# Patient Record
Sex: Male | Born: 1990 | Race: Black or African American | Hispanic: No | Marital: Single | Smoking: Current every day smoker
Health system: Southern US, Community
[De-identification: ages and names within clinical notes are randomized; demographics above are authoritative.]

## PROBLEM LIST (undated history)

## (undated) DIAGNOSIS — K219 Gastro-esophageal reflux disease without esophagitis: Secondary | ICD-10-CM

---
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
## 2011-08-14 ENCOUNTER — Emergency Department: Payer: Self-pay | Admitting: Emergency Medicine

## 2011-08-22 ENCOUNTER — Emergency Department: Payer: Self-pay | Admitting: Emergency Medicine

## 2012-06-09 ENCOUNTER — Emergency Department: Payer: Self-pay | Admitting: Emergency Medicine

## 2012-08-07 ENCOUNTER — Emergency Department: Payer: Self-pay | Admitting: Emergency Medicine

## 2012-12-22 ENCOUNTER — Emergency Department: Payer: Self-pay | Admitting: Emergency Medicine

## 2012-12-23 LAB — COMPREHENSIVE METABOLIC PANEL
Bilirubin,Total: 0.4 mg/dL (ref 0.2–1.0)
Calcium, Total: 9.3 mg/dL (ref 8.5–10.1)
Glucose: 130 mg/dL — ABNORMAL HIGH (ref 65–99)
Osmolality: 283 (ref 275–301)
SGOT(AST): 16 U/L (ref 15–37)
SGPT (ALT): 27 U/L (ref 12–78)
Total Protein: 7.5 g/dL (ref 6.4–8.2)

## 2012-12-23 LAB — URINALYSIS, COMPLETE
Bilirubin,UR: NEGATIVE
Glucose,UR: NEGATIVE mg/dL (ref 0–75)
Ketone: NEGATIVE
Protein: 100
Specific Gravity: 1.032 (ref 1.003–1.030)
WBC UR: 16 /HPF (ref 0–5)

## 2012-12-23 LAB — CBC
HCT: 42.6 % (ref 40.0–52.0)
MCH: 30.8 pg (ref 26.0–34.0)
Platelet: 128 10*3/uL — ABNORMAL LOW (ref 150–440)
RBC: 4.7 10*6/uL (ref 4.40–5.90)
WBC: 8.8 10*3/uL (ref 3.8–10.6)

## 2013-10-09 ENCOUNTER — Emergency Department: Payer: Self-pay | Admitting: Emergency Medicine

## 2013-10-09 LAB — URINALYSIS, COMPLETE
BILIRUBIN, UR: NEGATIVE
BLOOD: NEGATIVE
GLUCOSE, UR: NEGATIVE mg/dL (ref 0–75)
Ketone: NEGATIVE
LEUKOCYTE ESTERASE: NEGATIVE
Nitrite: NEGATIVE
Ph: 8 (ref 4.5–8.0)
Protein: 100
RBC,UR: 2 /HPF (ref 0–5)
SPECIFIC GRAVITY: 1.025 (ref 1.003–1.030)

## 2013-10-09 LAB — COMPREHENSIVE METABOLIC PANEL
ALBUMIN: 4.3 g/dL (ref 3.4–5.0)
ALT: 27 U/L
Alkaline Phosphatase: 72 U/L
Anion Gap: 8 (ref 7–16)
BILIRUBIN TOTAL: 0.7 mg/dL (ref 0.2–1.0)
BUN: 11 mg/dL (ref 7–18)
CO2: 26 mmol/L (ref 21–32)
Calcium, Total: 9.4 mg/dL (ref 8.5–10.1)
Chloride: 107 mmol/L (ref 98–107)
Creatinine: 1.02 mg/dL (ref 0.60–1.30)
EGFR (Non-African Amer.): >60
Glucose: 107 mg/dL — ABNORMAL HIGH (ref 65–99)
OSMOLALITY: 281 (ref 275–301)
Potassium: 3.7 mmol/L (ref 3.5–5.1)
SGOT(AST): 31 U/L (ref 15–37)
Sodium: 141 mmol/L (ref 136–145)
TOTAL PROTEIN: 8.3 g/dL — AB (ref 6.4–8.2)

## 2013-10-09 LAB — CBC WITH DIFFERENTIAL/PLATELET
Basophil #: 0 10*3/uL (ref 0.0–0.1)
Basophil %: 0.2 %
EOS PCT: 0.4 %
Eosinophil #: 0 10*3/uL (ref 0.0–0.7)
HCT: 48.9 % (ref 40.0–52.0)
HGB: 15.8 g/dL (ref 13.0–18.0)
LYMPHS PCT: 11.6 %
Lymphocyte #: 1.3 10*3/uL (ref 1.0–3.6)
MCH: 30 pg (ref 26.0–34.0)
MCHC: 32.4 g/dL (ref 32.0–36.0)
MCV: 93 fL (ref 80–100)
MONO ABS: 0.6 x10 3/mm (ref 0.2–1.0)
Monocyte %: 5.9 %
NEUTROS PCT: 81.9 %
Neutrophil #: 8.9 10*3/uL — ABNORMAL HIGH (ref 1.4–6.5)
Platelet: 145 10*3/uL — ABNORMAL LOW (ref 150–440)
RBC: 5.27 10*6/uL (ref 4.40–5.90)
RDW: 13.5 % (ref 11.5–14.5)
WBC: 10.8 10*3/uL — ABNORMAL HIGH (ref 3.8–10.6)

## 2013-10-09 LAB — TROPONIN I: Troponin-I: <0.02 ng/mL

## 2014-02-03 ENCOUNTER — Emergency Department: Payer: Self-pay | Admitting: Emergency Medicine

## 2014-04-24 ENCOUNTER — Emergency Department: Payer: Self-pay | Admitting: Student

## 2014-08-04 ENCOUNTER — Encounter: Payer: Self-pay | Admitting: Emergency Medicine

## 2014-08-04 ENCOUNTER — Emergency Department
Admission: EM | Admit: 2014-08-04 | Discharge: 2014-08-04 | Disposition: A | Discharge status: Home / Self Care | Payer: Self-pay | Attending: Emergency Medicine | Admitting: Emergency Medicine

## 2014-08-04 ENCOUNTER — Emergency Department: Payer: Self-pay

## 2014-08-04 DIAGNOSIS — W19XXXA Unspecified fall, initial encounter: Secondary | ICD-10-CM

## 2014-08-04 DIAGNOSIS — Y990 Civilian activity done for income or pay: Secondary | ICD-10-CM | POA: Insufficient documentation

## 2014-08-04 DIAGNOSIS — Y9389 Activity, other specified: Secondary | ICD-10-CM | POA: Insufficient documentation

## 2014-08-04 DIAGNOSIS — Z880 Allergy status to penicillin: Secondary | ICD-10-CM | POA: Insufficient documentation

## 2014-08-04 DIAGNOSIS — Z720 Tobacco use: Secondary | ICD-10-CM | POA: Insufficient documentation

## 2014-08-04 DIAGNOSIS — Y9289 Other specified places as the place of occurrence of the external cause: Secondary | ICD-10-CM | POA: Insufficient documentation

## 2014-08-04 DIAGNOSIS — W1839XA Other fall on same level, initial encounter: Secondary | ICD-10-CM | POA: Insufficient documentation

## 2014-08-04 DIAGNOSIS — S79912A Unspecified injury of left hip, initial encounter: Secondary | ICD-10-CM | POA: Insufficient documentation

## 2014-08-04 HISTORY — DX: Gastro-esophageal reflux disease without esophagitis: K21.9

## 2014-08-04 MED ORDER — IBUPROFEN 800 MG PO TABS: 800.0000 mg | ORAL_TABLET | Freq: Three times a day (TID) | ORAL | Status: DC | PRN

## 2014-08-04 NOTE — ED Notes (Signed)
Patient presents to the ED with left hip pain since early Sat. morning.  Patient states he was carrying a large amount of ice and he slipped and fell on his left hip.  Patient ambulatory to registration desk with no obvious difficulty.

## 2014-08-04 NOTE — ED Notes (Signed)
NAD noted at time of D/C. Pt denies questions or concerns. Pt ambulatory to the lobby at this time.  

## 2014-08-04 NOTE — ED Provider Notes (Signed)
[facility] Emergency Department Provider Note   ____________________________________________  Time seen: Approximately 10:49 AM  I have reviewed the triage vital signs and the nursing notes.   HISTORY  Chief Complaint Hip Pain    HPI Lance Hammond is a 24 y.o. male resents with complaints of left hip pain following a fall yesterday at work. States that the pain is progressed all through the night. Rates it as 8/10. Denies any visible bruising or pain elsewhere. Denies any hitting of head or LOC.   Past Medical History  Diagnosis Date  . Acid reflux     There are no active problems to display for this patient.   History reviewed. No pertinent past surgical history.  Current Outpatient Rx  Name  Route  Sig  Dispense  Refill  . ibuprofen (ADVIL,MOTRIN) 800 MG tablet   Oral   Take 1 tablet (800 mg total) by mouth every 8 (eight) hours as needed.   30 tablet   0     Allergies Amoxicillin  History reviewed. No pertinent family history.  Social History History  Substance Use Topics  . Smoking status: Current Every Day Smoker -- 0.50 packs/day for 6 years  . Smokeless tobacco: Not on file  . Alcohol Use: Yes     Comment: occasional    Review of Systems Constitutional: No fever/chills Eyes: No visual changes. ENT: No sore throat. Cardiovascular: Denies chest pain. Respiratory: Denies shortness of breath. Gastrointestinal: No abdominal pain.  No nausea, no vomiting.  No diarrhea.  No constipation. Genitourinary: Negative for dysuria. Musculoskeletal: Negative for back pain. Positive for left lateral hip pain only.  Skin: Negative for rash. Neurological: Negative for headaches, focal weakness or numbness.  10-point ROS otherwise negative.  ____________________________________________   PHYSICAL EXAM:  VITAL SIGNS: ED Triage Vitals  Enc Vitals Group     BP 08/04/14 1032 127/69 mmHg     Pulse Rate 08/04/14 1032 66     Resp  08/04/14 1032 15     Temp 08/04/14 1032 97.5 F (36.4 C)     Temp Source 08/04/14 1032 Oral     SpO2 08/04/14 1032 99 %     Weight 08/04/14 1032 145 lb (65.772 kg)     Height 08/04/14 1032  (1.803 m)     Head Cir --      Peak Flow --      Pain Score 08/04/14 1027 8     Pain Loc --      Pain Edu? --      Excl. in GC? --     Constitutional: Alert and oriented. Well appearing and in no acute distress. Musculoskeletal: No lower extremity tenderness nor edema.  No joint effusions.Positive for left lateral hip pain only. Ambulates without difficulty. Full range of motion left leg. Neurologic:  Normal speech and language. No gross focal neurologic deficits are appreciated. Speech is normal. No gait instability. No obvious bruising. Skin:  Skin is warm, dry and intact. No rash noted. Psychiatric: Mood and affect are normal. Speech and behavior are normal.  ____________________________________________   LABS (all labs ordered are listed, but only abnormal results are displayed)  Labs Reviewed - No data to display ____________________________________________  EKG  Not applicable ____________________________________________  RADIOLOGY  X-rays interpreted by radiologist and reviewed by myself. Negative for fracture dislocation. ____________________________________________   PROCEDURES  Procedure(s) performed: None  Critical Care performed: No  ____________________________________________   INITIAL IMPRESSION / ASSESSMENT AND PLAN / ED COURSE  Pertinent labs & imaging results that were available during my care of the patient were reviewed by me and considered in my medical decision making (see chart for details).  Left hip contusion. Rx Motrin 800 mg 3 times a day. Will return to the ER if worsening of symptoms. No other PCE at this time. ____________________________________________   FINAL CLINICAL IMPRESSION(S) / ED DIAGNOSES  Final diagnoses:  Fall  Hip  injury, left, initial encounter      Evangeline DakinCharles M Maleiya Pergola, PA-C 08/04/14 1225  Jene Everyobert Kinner, MD 08/04/14 [phone]

## 2014-08-04 NOTE — ED Notes (Signed)
Pt c/o left hip pain 8/10 throbbing following a fall at work early yesterday morning.  He has not taken any medication for his symptoms.  He states that there is swelling present but no visible bruising.  Denies pain anywhere else.  Denies hitting head.  No LOC.

## 2015-02-15 ENCOUNTER — Emergency Department
Admission: EM | Admit: 2015-02-15 | Discharge: 2015-02-15 | Disposition: A | Discharge status: Home / Self Care | Payer: Self-pay | Attending: Emergency Medicine | Admitting: Emergency Medicine

## 2015-02-15 ENCOUNTER — Encounter: Payer: Self-pay | Admitting: *Deleted

## 2015-02-15 DIAGNOSIS — R809 Proteinuria, unspecified: Secondary | ICD-10-CM | POA: Insufficient documentation

## 2015-02-15 DIAGNOSIS — Z880 Allergy status to penicillin: Secondary | ICD-10-CM | POA: Insufficient documentation

## 2015-02-15 DIAGNOSIS — R1900 Intra-abdominal and pelvic swelling, mass and lump, unspecified site: Secondary | ICD-10-CM | POA: Insufficient documentation

## 2015-02-15 DIAGNOSIS — R05 Cough: Secondary | ICD-10-CM | POA: Insufficient documentation

## 2015-02-15 DIAGNOSIS — R109 Unspecified abdominal pain: Secondary | ICD-10-CM

## 2015-02-15 DIAGNOSIS — R1032 Left lower quadrant pain: Secondary | ICD-10-CM | POA: Insufficient documentation

## 2015-02-15 DIAGNOSIS — F17200 Nicotine dependence, unspecified, uncomplicated: Secondary | ICD-10-CM | POA: Insufficient documentation

## 2015-02-15 LAB — CBC WITH DIFFERENTIAL/PLATELET
BASOS PCT: 1 %
Basophils Absolute: 0.1 10*3/uL (ref 0–0.1)
Eosinophils Absolute: 0.4 10*3/uL (ref 0–0.7)
Eosinophils Relative: 5 %
HEMATOCRIT: 47.5 % (ref 40.0–52.0)
Hemoglobin: 15.4 g/dL (ref 13.0–18.0)
Lymphocytes Relative: 27 %
Lymphs Abs: 2.7 10*3/uL (ref 1.0–3.6)
MCH: 29.4 pg (ref 26.0–34.0)
MCHC: 32.5 g/dL (ref 32.0–36.0)
MCV: 90.7 fL (ref 80.0–100.0)
MONO ABS: 1.1 10*3/uL — AB (ref 0.2–1.0)
MONOS PCT: 11 %
NEUTROS ABS: 5.6 10*3/uL (ref 1.4–6.5)
Neutrophils Relative %: 56 %
Platelets: 145 10*3/uL — ABNORMAL LOW (ref 150–440)
RBC: 5.24 MIL/uL (ref 4.40–5.90)
RDW: 14 % (ref 11.5–14.5)
WBC: 9.9 10*3/uL (ref 3.8–10.6)

## 2015-02-15 LAB — URINALYSIS COMPLETE WITH MICROSCOPIC (ARMC ONLY)
BACTERIA UA: NONE SEEN
Bilirubin Urine: NEGATIVE
GLUCOSE, UA: NEGATIVE mg/dL
HGB URINE DIPSTICK: NEGATIVE
Ketones, ur: NEGATIVE mg/dL
LEUKOCYTES UA: NEGATIVE
Nitrite: NEGATIVE
PROTEIN: 30 mg/dL — AB
Specific Gravity, Urine: 1.029 (ref 1.005–1.030)
pH: 6 (ref 5.0–8.0)

## 2015-02-15 LAB — COMPREHENSIVE METABOLIC PANEL
ALBUMIN: 4.5 g/dL (ref 3.5–5.0)
ALT: 16 U/L — ABNORMAL LOW (ref 17–63)
AST: 22 U/L (ref 15–41)
Alkaline Phosphatase: 67 U/L (ref 38–126)
Anion gap: 7 (ref 5–15)
BUN: 18 mg/dL (ref 6–20)
CALCIUM: 9.8 mg/dL (ref 8.9–10.3)
CO2: 27 mmol/L (ref 22–32)
CREATININE: 0.89 mg/dL (ref 0.61–1.24)
Chloride: 106 mmol/L (ref 101–111)
GFR calc Af Amer: >60 mL/min (ref >60)
GFR calc non Af Amer: >60 mL/min (ref >60)
GLUCOSE: 88 mg/dL (ref 65–99)
Potassium: 4.5 mmol/L (ref 3.5–5.1)
Sodium: 140 mmol/L (ref 135–145)
Total Bilirubin: 0.9 mg/dL (ref 0.3–1.2)
Total Protein: 8.3 g/dL — ABNORMAL HIGH (ref 6.5–8.1)

## 2015-02-15 NOTE — Discharge Instructions (Signed)
If you have increased pain, fever, vomiting, swelling especially a bulging area that turns red or does not go down when you gently massage it concerning for hernia, or you feel worse in any way please return to the emergency room. We do note a slight amount of proteinuria. Which is protein in her urine. This is not related to anything that brought her in today but we do noticed that incidentally and we ask you follow-up as an outpatient. It is certainly possible to your abdominal pain was caused from coughing, however your lungs are clear and I do not hear pneumonia, if you encounter difficulty with breathing or worsening cough please return to the emergency department.

## 2015-02-15 NOTE — ED Notes (Signed)
Lab results reviewed. CBC and UA WNL. Awaiting CMP.

## 2015-02-15 NOTE — ED Provider Notes (Addendum)
[facility] Emergency Department Provider Note  ____________________________________________   I have reviewed the triage vital signs and the nursing notes.   HISTORY  Chief Complaint Abdominal Pain    HPI Lance Hammond is a 24 y.o. male presents today complaining of feeling that his left lower quadrant is tender in the little bit "swollen" after coughing for the last few days. Patient is a smoker has a nonproductive cough. Cough is actually getting better. He has no pain in that area at this time. He has not had a hernia in the past. He has no testicular or penile pain or discharge. No dysuria no urinary frequency no diarrhea and no change in his bowel or bladder habits no difficulty eating, states the pain is 1 out of 10 when it is bad. He has been having normal bowel movements. He wants to make sure everything is okay to her he states.  Past Medical History  Diagnosis Date  . Acid reflux     There are no active problems to display for this patient.   History reviewed. No pertinent past surgical history.  Current Outpatient Rx  Name  Route  Sig  Dispense  Refill  . ibuprofen (ADVIL,MOTRIN) 800 MG tablet   Oral   Take 1 tablet (800 mg total) by mouth every 8 (eight) hours as needed.   30 tablet   0     Allergies Amoxicillin  No family history on file.  Social History Social History  Substance Use Topics  . Smoking status: Current Every Day Smoker -- 0.50 packs/day for 6 years  . Smokeless tobacco: None  . Alcohol Use: Yes     Comment: occasional    Review of Systems Constitutional: No fever/chills Eyes: No visual changes. ENT: No sore throat. No stiff neck no neck pain Cardiovascular: Denies chest pain. Respiratory: Denies shortness of breath. Gastrointestinal:   no vomiting.  No diarrhea.  No constipation. Genitourinary: Negative for dysuria. Musculoskeletal: Negative lower extremity swelling Skin: Negative for  rash. Neurological: Negative for headaches, focal weakness or numbness. 10-point ROS otherwise negative.  ____________________________________________   PHYSICAL EXAM:  VITAL SIGNS: ED Triage Vitals  Enc Vitals Group     BP 02/15/15 1644 115/73 mmHg     Pulse Rate 02/15/15 1644 60     Resp 02/15/15 1644 18     Temp 02/15/15 1644 97.4 F (36.3 C)     Temp src --      SpO2 02/15/15 1644 99 %     Weight 02/15/15 1644 145 lb (65.772 kg)     Height 02/15/15 1644 6' (1.829 m)     Head Cir --      Peak Flow --      Pain Score 02/15/15 1930 2     Pain Loc --      Pain Edu? --      Excl. in GC? --     Constitutional: Alert and oriented. Well appearing and in no acute distress. Eyes: Conjunctivae are normal. PERRL. EOMI. Head: Atraumatic. Nose: No congestion/rhinnorhea. Mouth/Throat: Mucous membranes are moist.  Oropharynx non-erythematous. Neck: No stridor.   Nontender with no meningismus Cardiovascular: Normal rate, regular rhythm. Grossly normal heart sounds.  Good peripheral circulation. Respiratory: Normal respiratory effort.  No retractions. Lungs CTAB. Abdominal: Soft and nontender. No distention. No guarding no rebound, there is no evidence of any area of abnormality that I can detect on exam. When I asked the patient to specify the area he actually  pointed to the anterior superior iliac spine on the left which she thought was where he noticed the issue before. When I demonstrated the symmetry between the side of the other side than he states that the swelling must be gone now. There is no tenderness to these areas. Specifically there is no left lower quadrant tenderness or right lower quadrant tenderness. Back:  There is no focal tenderness or step off there is no midline tenderness there are no lesions noted. there is no CVA tenderness GU exam: No testicular mass no testicular swelling no penile mass no penile swelling no tenderness to palpation no erythema no scrotal pain or  discomfort no evidence of inguinal herniation during Valsalva. Musculoskeletal: No lower extremity tenderness. No joint effusions, no DVT signs strong distal pulses no edema Neurologic:  Normal speech and language. No gross focal neurologic deficits are appreciated.  Skin:  Skin is warm, dry and intact. No rash noted. Psychiatric: Mood and affect are normal. Speech and behavior are normal.  ____________________________________________   LABS (all labs ordered are listed, but only abnormal results are displayed)  Labs Reviewed  CBC WITH DIFFERENTIAL/PLATELET - Abnormal; Notable for the following:    Platelets 145 (*)    Monocytes Absolute 1.1 (*)    All other components within normal limits  COMPREHENSIVE METABOLIC PANEL - Abnormal; Notable for the following:    Total Protein 8.3 (*)    ALT 16 (*)    All other components within normal limits  URINALYSIS COMPLETEWITH MICROSCOPIC ([facility] ONLY) - Abnormal; Notable for the following:    Color, Urine YELLOW (*)    APPearance CLEAR (*)    Protein, ur 30 (*)    Squamous Epithelial / LPF 0-5 (*)    All other components within normal limits   ____________________________________________  EKG  I personally interpreted any EKGs ordered by me or triage  ____________________________________________  RADIOLOGY  I reviewed any imaging ordered by me or triage that were performed during my shift ____________________________________________   PROCEDURES  Procedure(s) performed: None  Critical Care performed: None  ____________________________________________   INITIAL IMPRESSION / ASSESSMENT AND PLAN / ED COURSE  Pertinent labs & imaging results that were available during my care of the patient were reviewed by me and considered in my medical decision making (see chart for details).  Patient here because he noted that his abdomen seemed to be funny in appearance and possibly slightly tender, I do not detect the upper mildly he  indicates. There is no evidence of a direct or indirect hernia there is no evidence of an inguinal bulge or abnormality there is no evidence of appendicitis there is no evidence of testicular or scrotal pain or discomfort, patient has mild pertinent or you which we have made him aware of him which we have advised him to follow-up for. However I do not think that is causing his symptoms. Patient has no complaints at this time he is pain-free, he is nontender, there is no abnormality noted to exam, his vital signs are reassuring and his blood work is reassuring. I have explained him that possible that he may have a slight herniation although he has no evidence of incarceration and no evidence of hernia that I can detect. Patient does not have worsening symptoms when he lists heavy objects. I've advised him that if there is a hernia that I am not seeing it could remanifest itself, and therefore he needs to be vigilant. I've advised him on what to look out for  in terms of incarcerated hernia or slow or smoldering intra-abdominal pathology which is not at this time evident to medical investigation. Patient very comfortable with this plan. Again he will follow pulses worse proteinuria return precautions and follow-up and been given and understood, patient does have a history of proteinuria which is been much worse than it is today. Fluids are slightly low as well but this is also baseline and we have again advise close outpatient follow-up.  ----------------------------------------- 8:21 PM on 02/15/2015 -----------------------------------------  Patient with serial abdominal exams revealing no abdominal pathology this includes during Valsalva maneuver and also while standing up. ____________________________________________   FINAL CLINICAL IMPRESSION(S) / ED DIAGNOSES  Final diagnoses:  None     Jeanmarie Plant, MD 02/15/15 1957  Jeanmarie Plant, MD 02/15/15 [id number]  Jeanmarie Plant, MD 02/15/15  2022

## 2015-02-15 NOTE — ED Notes (Signed)
All labs WNL.

## 2015-02-15 NOTE — ED Notes (Signed)
Patient report 2/10 left lower abdominal pain that began today after noticing LLQ being swollen and firm to the touch. Patient denies urinary symptoms or constipation. Patient denies recent trauma to the area.

## 2016-04-24 ENCOUNTER — Emergency Department
Admission: EM | Admit: 2016-04-24 | Discharge: 2016-04-24 | Disposition: A | Discharge status: Home / Self Care | Payer: Self-pay | Attending: Emergency Medicine | Admitting: Emergency Medicine

## 2016-04-24 ENCOUNTER — Emergency Department: Payer: Self-pay

## 2016-04-24 ENCOUNTER — Encounter: Payer: Self-pay | Admitting: Medical Oncology

## 2016-04-24 DIAGNOSIS — F17200 Nicotine dependence, unspecified, uncomplicated: Secondary | ICD-10-CM | POA: Insufficient documentation

## 2016-04-24 DIAGNOSIS — R0789 Other chest pain: Secondary | ICD-10-CM | POA: Insufficient documentation

## 2016-04-24 DIAGNOSIS — R05 Cough: Secondary | ICD-10-CM | POA: Insufficient documentation

## 2016-04-24 MED ORDER — NAPROXEN 500 MG PO TABS: 500.0000 mg | ORAL_TABLET | Freq: Two times a day (BID) | ORAL | 0 refills | Status: DC

## 2016-04-24 NOTE — ED Triage Notes (Signed)
Pt ambulatory to triage with reports of left sided chest pain that began last night, pt reports that the pain is worse when he moves and bends over. Pt also reports he has had a cough.

## 2016-04-24 NOTE — ED Notes (Signed)
Pt verbalized understanding of discharge instructions. NAD at this time. 

## 2016-04-24 NOTE — Discharge Instructions (Signed)
Follow up with the primary care provider of your choice for symptoms that are not improving over the next few days.  Return to the ER for symptoms that change or worsen if unable to schedule an appointment. 

## 2016-04-24 NOTE — ED Notes (Signed)
Dr Lenard LancePaduchowski signed EKG and states that pt is okay to go to [facility] care area.

## 2016-04-24 NOTE — ED Provider Notes (Signed)
[facility] Emergency Department Provider Note ____________________________________________   None    (approximate)  I have reviewed the triage vital signs and the nursing notes.   HISTORY  Chief Complaint Chest Pain   HPI Lance Hammond is a 26 y.o. male who presents to the emergency department for evaluation of left side chest pain. He reports intermittent cough for the past 10 days. He denies fever. Pain worsens with deep breath and movement. He took "2 low dose aspirin" without relief. He denies cardiac history.   Past Medical History:  Diagnosis Date  . Acid reflux     There are no active problems to display for this patient.   History reviewed. No pertinent surgical history.  Prior to Admission medications   Medication Sig Start Date End Date Taking? Authorizing Provider  naproxen (NAPROSYN) 500 MG tablet Take 1 tablet (500 mg total) by mouth 2 (two) times daily with a meal. 04/24/16   Chinita Pester, FNP    Allergies Amoxicillin  No family history on file.  Social History Social History  Substance Use Topics  . Smoking status: Current Every Day Smoker    Packs/day: 0.50    Years: 6.00  . Smokeless tobacco: Never Used  . Alcohol use Yes     Comment: occasional    Review of Systems Constitutional: No fever/chills Cardiovascular: Positive for chest pain. Respiratory: Denies shortness of breath. Gastrointestinal: No abdominal pain.  No nausea, no vomiting.  No diarrhea. Musculoskeletal: Positive for chest wall pain Neurological: Negative for headaches, focal weakness or numbness. ____________________________________________   PHYSICAL EXAM:  VITAL SIGNS: ED Triage Vitals  Enc Vitals Group     BP 04/24/16 0721 125/81     Pulse Rate 04/24/16 0721 60     Resp 04/24/16 0721 15     Temp 04/24/16 0721 98.6 F (37 C)     Temp src --      SpO2 04/24/16 0721 99 %     Weight 04/24/16 0718 145 lb (65.8 kg)     Height  04/24/16 0718 6' (1.829 m)     Head Circumference --      Peak Flow --      Pain Score 04/24/16 0718 7     Pain Loc --      Pain Edu? --      Excl. in GC? --     Constitutional: Alert and oriented. Well appearing and in no acute distress. Eyes: Conjunctivae are normal. PERRL. EOMI. Mouth/Throat: Mucous membranes are moist.  Oropharynx non-erythematous. Neck: No stridor.   Cardiovascular: Normal rate, regular rhythm. Grossly normal heart sounds.  Good peripheral circulation. Respiratory: Normal respiratory effort.  No retractions. Lungs CTAB. Musculoskeletal: Left anterior chest wall tender to palpation. Neurologic:  Normal speech and language.  Skin:  Skin is warm, dry and intact. No rash noted. Psychiatric: Mood and affect are normal. Speech and behavior are normal.  ____________________________________________   LABS (all labs ordered are listed, but only abnormal results are displayed)  Labs Reviewed - No data to display ____________________________________________  EKG  ED ECG REPORT  Date: 04/24/2016 EKG Time: 0721 Rate: 61 Rhythm: normal sinus rhythm QRS Axis: left Intervals: normal ST/T Wave abnormalities: normal Conduction Disturbances: none Narrative Interpretation: unremarkable  ____________________________________________  RADIOLOGY  Chest x-ray negative for cardiopulmonary abnormality per radiology. ____________________________________________   PROCEDURES  Procedure(s) performed: None  Procedures  Critical Care performed: No  ____________________________________________   INITIAL IMPRESSION / ASSESSMENT AND PLAN / ED  COURSE  Pertinent labs & imaging results that were available during my care of the patient were reviewed by me and considered in my medical decision making (see chart for details).  26 year old male presenting for evaluation of chest wall pain. EKG and chest x-ray reassuring. Pain is reproducible with movement and palpation.  He will be treated with naprosyn and advised to follow up with the PCP of his choice if not improving over the next few days or return to the ER for symptoms that change or worsen.      ____________________________________________   FINAL CLINICAL IMPRESSION(S) / ED DIAGNOSES  Final diagnoses:  Chest wall pain      NEW MEDICATIONS STARTED DURING THIS VISIT:  Discharge Medication List as of 04/24/2016  8:04 AM    START taking these medications   Details  naproxen (NAPROSYN) 500 MG tablet Take 1 tablet (500 mg total) by mouth 2 (two) times daily with a meal., Starting Sat 04/24/2016, Print         Note:  This document was prepared using Dragon voice recognition software and may include unintentional dictation errors.    Chinita PesterCari B Lucresha Dismuke, FNP 04/24/16 0830    Minna AntisKevin Paduchowski, MD 04/24/16 [phone]

## 2018-01-04 ENCOUNTER — Emergency Department
Admission: EM | Admit: 2018-01-04 | Discharge: 2018-01-04 | Disposition: A | Discharge status: Home / Self Care | Payer: Self-pay | Attending: Emergency Medicine | Admitting: Emergency Medicine

## 2018-01-04 ENCOUNTER — Other Ambulatory Visit: Payer: Self-pay

## 2018-01-04 ENCOUNTER — Encounter: Payer: Self-pay | Admitting: Emergency Medicine

## 2018-01-04 DIAGNOSIS — T7840XA Allergy, unspecified, initial encounter: Secondary | ICD-10-CM | POA: Insufficient documentation

## 2018-01-04 DIAGNOSIS — L509 Urticaria, unspecified: Secondary | ICD-10-CM | POA: Insufficient documentation

## 2018-01-04 DIAGNOSIS — F1210 Cannabis abuse, uncomplicated: Secondary | ICD-10-CM | POA: Insufficient documentation

## 2018-01-04 DIAGNOSIS — F17210 Nicotine dependence, cigarettes, uncomplicated: Secondary | ICD-10-CM | POA: Insufficient documentation

## 2018-01-04 MED ORDER — PREDNISONE 10 MG PO TABS: ORAL_TABLET | ORAL | 0 refills | Status: DC

## 2018-01-04 MED ORDER — METHYLPREDNISOLONE SODIUM SUCC 125 MG IJ SOLR
125.0000 mg | Freq: Once | INTRAMUSCULAR | Status: DC
  Filled 2018-01-04: qty 2

## 2018-01-04 MED ORDER — DIPHENHYDRAMINE HCL 25 MG PO CAPS: 25.0000 mg | ORAL_CAPSULE | ORAL | 0 refills | Status: DC | PRN

## 2018-01-04 MED ORDER — FAMOTIDINE 20 MG PO TABS
20.0000 mg | ORAL_TABLET | Freq: Once | ORAL | Status: AC
  Administered 2018-01-04: 20 mg via ORAL
  Filled 2018-01-04: qty 1

## 2018-01-04 NOTE — ED Triage Notes (Signed)
PT c/o rash with itch since yesterday throughout. Denies fevers. NAD noted , VSS

## 2018-01-04 NOTE — ED Notes (Signed)
First Nurse Note: patient complaining of "hives", no SHOB.  Alert and oriented, NAD.

## 2018-01-04 NOTE — ED Notes (Signed)
See triage note  States he developed rash/hives about 2 days ago  Positive itching   No resp

## 2018-01-04 NOTE — ED Provider Notes (Signed)
[facility] Emergency Department Provider Note  ____________________________________________  Time seen: Approximately 10:04 AM  I have reviewed the triage vital signs and the nursing notes.   HISTORY  Chief Complaint Urticaria    HPI Lance Hammond is a 27 y.o. male that presents to the emergency department for rash and urticaria for 2 days. It started on his right arm and has been spreading. He changed his body soap one week ago and also purchased new underwear. No new medications. He has 2 dogs. No insect or tick bites. He has not been outside in the woods recently. He has taken benadryl, which is helping him sleep but not improving the itching. No SOB, nausea, vomiting.   Past Medical History:  Diagnosis Date  . Acid reflux     There are no active problems to display for this patient.   History reviewed. No pertinent surgical history.  Prior to Admission medications   Medication Sig Start Date End Date Taking? Authorizing Provider  diphenhydrAMINE (BENADRYL) 25 mg capsule Take 1 capsule (25 mg total) by mouth every 4 (four) hours as needed. 01/04/18 01/04/19  Enid Derry, PA-C  naproxen (NAPROSYN) 500 MG tablet Take 1 tablet (500 mg total) by mouth 2 (two) times daily with a meal. 04/24/16   Triplett, Cari B, FNP  predniSONE (DELTASONE) 10 MG tablet Take 6 tablets on day 1, take 5 tablets on day 2, take 4 tablets on day 3, take 3 tablets on day 4, take 2 tablets on day 5, take 1 tablet on day 6 01/04/18   Enid Derry, PA-C    Allergies Amoxicillin  No family history on file.  Social History Social History   Tobacco Use  . Smoking status: Current Every Day Smoker    Packs/day: 0.50    Years: 6.00    Pack years: 3.00  . Smokeless tobacco: Never Used  Substance Use Topics  . Alcohol use: Yes    Comment: occasional  . Drug use: Yes    Types: Marijuana    Comment: Daily      Review of Systems  Constitutional: No  fever/chills Cardiovascular: No chest pain. Respiratory:  No SOB. Gastrointestinal: No nausea, no vomiting.  Musculoskeletal: Negative for musculoskeletal pain. Skin: Negative for abrasions, lacerations, ecchymosis. Positive for rash.  Neurological: Negative for headaches, numbness or tingling   ____________________________________________   PHYSICAL EXAM:  VITAL SIGNS: ED Triage Vitals  Enc Vitals Group     BP 01/04/18 0916 120/75     Pulse Rate 01/04/18 0916 79     Resp 01/04/18 0916 18     Temp 01/04/18 0916 97.8 F (36.6 C)     Temp Source 01/04/18 0916 Oral     SpO2 01/04/18 0916 100 %     Weight --      Height --      Head Circumference --      Peak Flow --      Pain Score 01/04/18 0918 0     Pain Loc --      Pain Edu? --      Excl. in GC? --      Constitutional: Alert and oriented. Well appearing and in no acute distress. Eyes: Conjunctivae are normal. PERRL. EOMI. Head: Atraumatic. ENT:      Ears:      Nose: No congestion/rhinnorhea.      Mouth/Throat: Mucous membranes are moist.  Neck: No stridor.   Cardiovascular: Normal rate, regular rhythm.  Good peripheral circulation.  Respiratory: Normal respiratory effort without tachypnea or retractions. Lungs CTAB. Good air entry to the bases with no decreased or absent breath sounds. Musculoskeletal: Full range of motion to all extremities. No gross deformities appreciated. Neurologic:  Normal speech and language. No gross focal neurologic deficits are appreciated.  Skin:  Skin is warm, dry and intact. Wheals to bilateral arms, trunk, legs.  Psychiatric: Mood and affect are normal. Speech and behavior are normal. Patient exhibits appropriate insight and judgement.   ____________________________________________   LABS (all labs ordered are listed, but only abnormal results are displayed)  Labs Reviewed - No data to  display ____________________________________________  EKG   ____________________________________________  RADIOLOGY  No results found.  ____________________________________________    PROCEDURES  Procedure(s) performed:    Procedures    Medications  methylPREDNISolone sodium succinate (SOLU-MEDROL) 125 mg/2 mL injection 125 mg (125 mg Intramuscular Refused 01/04/18 1053)  famotidine (PEPCID) tablet 20 mg (20 mg Oral Given 01/04/18 1053)     ____________________________________________   INITIAL IMPRESSION / ASSESSMENT AND PLAN / ED COURSE  Pertinent labs & imaging results that were available during my care of the patient were reviewed by me and considered in my medical decision making (see chart for details).  Review of the [place] CSRS was performed in accordance of the [facility] prior to dispensing any controlled drugs.     Patient's diagnosis is consistent with allergic reaction. Reaction likely from body wash. Patient will be discharged home with prescriptions for prednisone and benadryl. Patient is to follow up with PCP as directed. Patient is given ED precautions to return to the ED for any worsening or new symptoms.     ____________________________________________  FINAL CLINICAL IMPRESSION(S) / ED DIAGNOSES  Final diagnoses:  Allergic reaction, initial encounter      NEW MEDICATIONS STARTED DURING THIS VISIT:  ED Discharge Orders         Ordered    predniSONE (DELTASONE) 10 MG tablet     01/04/18 1044    diphenhydrAMINE (BENADRYL) 25 mg capsule  Every 4 hours PRN     01/04/18 1044              This chart was dictated using voice recognition software/Dragon. Despite best efforts to proofread, errors can occur which can change the meaning. Any change was purely unintentional.    Enid Derry, PA-C 01/04/18 1304    Sharman Cheek, MD 01/04/18 1538

## 2018-02-17 ENCOUNTER — Emergency Department: Payer: Self-pay

## 2018-02-17 ENCOUNTER — Other Ambulatory Visit: Payer: Self-pay

## 2018-02-17 ENCOUNTER — Emergency Department
Admission: EM | Admit: 2018-02-17 | Discharge: 2018-02-17 | Disposition: A | Discharge status: Home / Self Care | Payer: Self-pay | Attending: Emergency Medicine | Admitting: Emergency Medicine

## 2018-02-17 ENCOUNTER — Encounter: Payer: Self-pay | Admitting: Emergency Medicine

## 2018-02-17 DIAGNOSIS — Y9289 Other specified places as the place of occurrence of the external cause: Secondary | ICD-10-CM | POA: Insufficient documentation

## 2018-02-17 DIAGNOSIS — F1210 Cannabis abuse, uncomplicated: Secondary | ICD-10-CM | POA: Insufficient documentation

## 2018-02-17 DIAGNOSIS — F17210 Nicotine dependence, cigarettes, uncomplicated: Secondary | ICD-10-CM | POA: Insufficient documentation

## 2018-02-17 DIAGNOSIS — W19XXXA Unspecified fall, initial encounter: Secondary | ICD-10-CM

## 2018-02-17 DIAGNOSIS — W1789XA Other fall from one level to another, initial encounter: Secondary | ICD-10-CM | POA: Insufficient documentation

## 2018-02-17 DIAGNOSIS — Y9389 Activity, other specified: Secondary | ICD-10-CM | POA: Insufficient documentation

## 2018-02-17 DIAGNOSIS — S60222A Contusion of left hand, initial encounter: Secondary | ICD-10-CM | POA: Insufficient documentation

## 2018-02-17 DIAGNOSIS — Y990 Civilian activity done for income or pay: Secondary | ICD-10-CM | POA: Insufficient documentation

## 2018-02-17 NOTE — Discharge Instructions (Addendum)
Follow-up with Dr. Odis LusterBowers or a orthopedic of [employer] choice if you are not better in 3 to 5 days.  Wear the fingers buddy taped for several days.  Take Tylenol and ibuprofen as needed for pain.  Apply ice.  Return if worsening.

## 2018-02-17 NOTE — ED Notes (Signed)
Buddy tape to fingers applied by NT.

## 2018-02-17 NOTE — ED Triage Notes (Signed)
Pt to ED via POV, states that he was at work and fell off the loading dock and tried to catch himself. Pt c/o left hand pain. Pt is in NAD

## 2018-02-17 NOTE — ED Provider Notes (Signed)
[facility] Emergency Department Provider Note  ____________________________________________   First MD Initiated Contact with Patient 02/17/18 1523     (approximate)  I have reviewed the triage vital signs and the nursing notes.   HISTORY  Chief Complaint Hand Injury    HPI Lance Hammond is a 27 y.o. male presents to the emergency department complaining of left hand pain.  He states he fell off a 5 foot loading dock and tried to catch himself.  He denies any other injuries at this time.  He denies any numbness or tingling.  He denies head injury.    Past Medical History:  Diagnosis Date  . Acid reflux     There are no active problems to display for this patient.   History reviewed. No pertinent surgical history.  Prior to Admission medications   Medication Sig Start Date End Date Taking? Authorizing Provider  diphenhydrAMINE (BENADRYL) 25 mg capsule Take 1 capsule (25 mg total) by mouth every 4 (four) hours as needed. 01/04/18 01/04/19  Enid Derry, PA-C  naproxen (NAPROSYN) 500 MG tablet Take 1 tablet (500 mg total) by mouth 2 (two) times daily with a meal. 04/24/16   Triplett, Cari B, FNP  predniSONE (DELTASONE) 10 MG tablet Take 6 tablets on day 1, take 5 tablets on day 2, take 4 tablets on day 3, take 3 tablets on day 4, take 2 tablets on day 5, take 1 tablet on day 6 01/04/18   Enid Derry, PA-C    Allergies Amoxicillin  No family history on file.  Social History Social History   Tobacco Use  . Smoking status: Current Every Day Smoker    Packs/day: 0.50    Years: 6.00    Pack years: 3.00  . Smokeless tobacco: Never Used  Substance Use Topics  . Alcohol use: Yes    Comment: occasional  . Drug use: Yes    Types: Marijuana    Comment: Daily     Review of Systems  Constitutional: No fever/chills Eyes: No visual changes. ENT: No sore throat. Respiratory: Denies cough Genitourinary: Negative for  dysuria. Musculoskeletal: Negative for back pain.  Left hand pain Skin: Negative for rash.    ____________________________________________   PHYSICAL EXAM:  VITAL SIGNS: ED Triage Vitals [02/17/18 1524]  Enc Vitals Group     BP 117/75     Pulse Rate 79     Resp      Temp 98.2 F (36.8 C)     Temp Source Oral     SpO2 100 %     Weight 146 lb (66.2 kg)     Height 6' (1.829 m)     Head Circumference      Peak Flow      Pain Score      Pain Loc      Pain Edu?      Excl. in GC?     Constitutional: Alert and oriented. Well appearing and in no acute distress. Eyes: Conjunctivae are normal.  Head: Atraumatic. Nose: No congestion/rhinnorhea. Mouth/Throat: Mucous membranes are moist.   Neck:  supple no lymphadenopathy noted Cardiovascular: Normal rate, regular rhythm. Heart sounds are normal Respiratory: Normal respiratory effort.  No retractions, lungs c t a  GU: deferred Musculoskeletal: FROM all extremities, warm and well perfused, the left hand has tender swollen fingers at the third fourth MCPs.  Neurovascular is intact Neurologic:  Normal speech and language.  Skin:  Skin is warm, dry and intact. No rash  noted. Psychiatric: Mood and affect are normal. Speech and behavior are normal.  ____________________________________________   LABS (all labs ordered are listed, but only abnormal results are displayed)  Labs Reviewed - No data to display ____________________________________________   ____________________________________________  RADIOLOGY  X-ray of the left hand is negative for fracture  ____________________________________________   PROCEDURES  Procedure(s) performed: Fingers were buddy taped by the nursing staff  Procedures    ____________________________________________   INITIAL IMPRESSION / ASSESSMENT AND PLAN / ED COURSE  Pertinent labs & imaging results that were available during my care of the patient were reviewed by me and considered  in my medical decision making (see chart for details).   Patient is 27 year old male presents emergency department complaining of left hand pain after a fall.  Physical exam shows the third and fourth fingers of the left hand to be tender and swollen.  X-ray of the left hand is negative for fracture.  Explained the results to the patient.  The third and fourth fingers were buddy taped as a splint.  He is to apply ice.  Take Tylenol or ibuprofen as needed.  Follow-up with orthopedics if not better in 5 to 7 days.  He states he understands will comply.  He was discharged in stable condition.     As part of my medical decision making, I reviewed the following data within the electronic MEDICAL RECORD NUMBER Nursing notes reviewed and incorporated, Old chart reviewed, Radiograph reviewed x-ray left hand is negative, Notes from prior ED visits and [place] Controlled Substance Database  ____________________________________________   FINAL CLINICAL IMPRESSION(S) / ED DIAGNOSES  Final diagnoses:  Contusion of left hand, initial encounter  Fall, initial encounter      NEW MEDICATIONS STARTED DURING THIS VISIT:  New Prescriptions   No medications on file     Note:  This document was prepared using Dragon voice recognition software and may include unintentional dictation errors.    Faythe GheeFisher, Cavin Longman W, PA-C 02/17/18 1936    Jeanmarie PlantMcShane, James A, MD 02/17/18 2329

## 2018-10-22 ENCOUNTER — Emergency Department: Payer: Self-pay

## 2018-10-22 ENCOUNTER — Emergency Department
Admission: EM | Admit: 2018-10-22 | Discharge: 2018-10-22 | Disposition: A | Discharge status: Home / Self Care | Payer: Self-pay | Attending: Emergency Medicine | Admitting: Emergency Medicine

## 2018-10-22 ENCOUNTER — Other Ambulatory Visit: Payer: Self-pay

## 2018-10-22 DIAGNOSIS — S62339A Displaced fracture of neck of unspecified metacarpal bone, initial encounter for closed fracture: Secondary | ICD-10-CM

## 2018-10-22 DIAGNOSIS — F17200 Nicotine dependence, unspecified, uncomplicated: Secondary | ICD-10-CM | POA: Insufficient documentation

## 2018-10-22 DIAGNOSIS — Y998 Other external cause status: Secondary | ICD-10-CM | POA: Insufficient documentation

## 2018-10-22 DIAGNOSIS — Y9389 Activity, other specified: Secondary | ICD-10-CM | POA: Insufficient documentation

## 2018-10-22 DIAGNOSIS — Y929 Unspecified place or not applicable: Secondary | ICD-10-CM | POA: Insufficient documentation

## 2018-10-22 DIAGNOSIS — Z79899 Other long term (current) drug therapy: Secondary | ICD-10-CM | POA: Insufficient documentation

## 2018-10-22 DIAGNOSIS — S62316A Displaced fracture of base of fifth metacarpal bone, right hand, initial encounter for closed fracture: Secondary | ICD-10-CM | POA: Insufficient documentation

## 2018-10-22 DIAGNOSIS — W2209XA Striking against other stationary object, initial encounter: Secondary | ICD-10-CM | POA: Insufficient documentation

## 2018-10-22 MED ORDER — OXYCODONE-ACETAMINOPHEN 5-325 MG PO TABS: 1.0000 | ORAL_TABLET | ORAL | 0 refills | Status: DC | PRN

## 2018-10-22 MED ORDER — OXYCODONE-ACETAMINOPHEN 5-325 MG PO TABS
1.0000 | ORAL_TABLET | Freq: Once | ORAL | Status: AC
  Administered 2018-10-22: 1 via ORAL
  Filled 2018-10-22: qty 1

## 2018-10-22 NOTE — ED Provider Notes (Signed)
[facility] Emergency Department Provider Note _   First MD Initiated Contact with Patient 10/22/18 0309     (approximate)  I have reviewed the triage vital signs and the nursing notes.   HISTORY  Chief Complaint Hand Injury   HPI Lance Hammond is a 28 y.o. male presents to the emergency department 10 out of 10 right hand pain after punching a wall tonight.  Patient admits to persistent pain and swelling has progressed since the injury.       Past Medical History:  Diagnosis Date  . Acid reflux     There are no active problems to display for this patient.   History reviewed. No pertinent surgical history.  Prior to Admission medications   Medication Sig Start Date End Date Taking? Authorizing Provider  diphenhydrAMINE (BENADRYL) 25 mg capsule Take 1 capsule (25 mg total) by mouth every 4 (four) hours as needed. 01/04/18 01/04/19  Enid DerryWagner, Ashley, PA-C  naproxen (NAPROSYN) 500 MG tablet Take 1 tablet (500 mg total) by mouth 2 (two) times daily with a meal. 04/24/16   Triplett, Cari B, FNP  oxyCODONE-acetaminophen (PERCOCET) 5-325 MG tablet Take 1 tablet by mouth every 4 (four) hours as needed. 10/22/18 10/22/19  Darci CurrentBrown, [place] N, MD  predniSONE (DELTASONE) 10 MG tablet Take 6 tablets on day 1, take 5 tablets on day 2, take 4 tablets on day 3, take 3 tablets on day 4, take 2 tablets on day 5, take 1 tablet on day 6 01/04/18   Enid DerryWagner, Ashley, PA-C    Allergies Amoxicillin  No family history on file.  Social History Social History   Tobacco Use  . Smoking status: Current Every Day Smoker    Packs/day: 0.50    Years: 6.00    Pack years: 3.00  . Smokeless tobacco: Never Used  Substance Use Topics  . Alcohol use: Yes    Comment: occasional  . Drug use: Yes    Types: Marijuana    Comment: Daily     Review of Systems Constitutional: No fever/chills Eyes: No visual changes. ENT: No sore throat. Cardiovascular: Denies chest pain.  Respiratory: Denies shortness of breath. Gastrointestinal: No abdominal pain.  No nausea, no vomiting.  No diarrhea.  No constipation. Genitourinary: Negative for dysuria. Musculoskeletal: Negative for neck pain.  Negative for back pain.  Positive for right hand pain and swelling Integumentary: Negative for rash. Neurological: Negative for headaches, focal weakness or numbness.   ____________________________________________   PHYSICAL EXAM:  VITAL SIGNS: ED Triage Vitals [10/22/18 0018]  Enc Vitals Group     BP 121/73     Pulse Rate 77     Resp 18     Temp 99.1 F (37.3 C)     Temp src      SpO2 99 %     Weight 68.9 kg (152 lb)     Height 1.829 m (6')     Head Circumference      Peak Flow      Pain Score 10     Pain Loc      Pain Edu?      Excl. in GC?     Constitutional: Alert and oriented.  Eyes: Conjunctivae are normal.  Mouth/Throat: Mucous membranes are moist. Neck: No stridor.  No meningeal signs.   Cardiovascular: Normal rate, regular rhythm. Good peripheral circulation. Grossly normal heart sounds. Respiratory: Normal respiratory effort.  No retractions. Gastrointestinal: Soft and nontender. No distention.  Musculoskeletal: Apparent deformity of  the right fifth metacarpal.  Swollen tender to touch Neurologic:  Normal speech and language. No gross focal neurologic deficits are appreciated.  Skin:  Skin is warm, dry and intact. Psychiatric: Mood and affect are normal. Speech and behavior are normal.   RADIOLOGY I, Anna N Geordan Xu, personally viewed and evaluated these images (plain radiographs) as part of my medical decision making, as well as reviewing the written report by the radiologist.  ED MD interpretation: Fifth metacarpal fracture distally with angulation  Official radiology report(s): Dg Hand 2 View Right  Result Date: 10/22/2018 CLINICAL DATA:  Fracture reduction EXAM: RIGHT HAND - 2 VIEW COMPARISON:  Right hand radiographs earlier today  FINDINGS: Overlying splint obscures fine bone detail. There is stable mild apex lateral angulation at the distal right fifth metacarpal non articular fracture without significant displacement on this single view. No additional fractures. No dislocation. No suspicious focal osseous lesions. No significant arthropathy. No radiopaque foreign body. IMPRESSION: Stable mild apex lateral angulation at the distal right fifth metacarpal non articular fracture without significant displacement on this single view. Electronically Signed   By: Delbert PhenixJason A Poff M.D.   On: 10/22/2018 04:34   Dg Hand Complete Right  Result Date: 10/22/2018 CLINICAL DATA:  Recently punched wall with fifth digit pain, initial encounter EXAM: RIGHT HAND - COMPLETE 3+ VIEW COMPARISON:  None. FINDINGS: Findings consistent with a boxer's fracture of the distal fifth metacarpal are noted. Mild overlying soft tissue swelling is seen. No other fracture is noted. IMPRESSION: Fifth metacarpal fracture distally. Electronically Signed   By: Alcide CleverMark  Lukens M.D.   On: 10/22/2018 00:42      Procedures   ____________________________________________   INITIAL IMPRESSION / MDM / ASSESSMENT AND PLAN / ED COURSE  As part of my medical decision making, I reviewed the following data within the electronic MEDICAL RECORD NUMBER 28 year old male presented with above-stated history and physical exam consistent with a boxer's fracture of the right hand.  Patient given a Percocet in the emergency department reduction of the angulation attempted.  Ulnar gutter splint applied patient discussed with Dr. Martha ClanKrasinski orthopedic surgeon who will see the patient in the office today.  Patient will be prescribed Percocet for home.  ____________________________________________  FINAL CLINICAL IMPRESSION(S) / ED DIAGNOSES  Final diagnoses:  Closed boxer's fracture, initial encounter     MEDICATIONS GIVEN DURING THIS VISIT:  Medications  oxyCODONE-acetaminophen  (PERCOCET/ROXICET) 5-325 MG per tablet 1 tablet (1 tablet Oral Given 10/22/18 0314)     ED Discharge Orders         Ordered    oxyCODONE-acetaminophen (PERCOCET) 5-325 MG tablet  Every 4 hours PRN     10/22/18 0437          *Please note:  Lance Hammond was evaluated in Emergency Department on 10/22/2018 for the symptoms described in the history of present illness. He was evaluated in the context of the global COVID-19 pandemic, which necessitated consideration that the patient might be at risk for infection with the SARS-CoV-2 virus that causes COVID-19. Institutional protocols and algorithms that pertain to the evaluation of patients at risk for COVID-19 are in a state of rapid change based on information released by regulatory bodies including the CDC and federal and state organizations. These policies and algorithms were followed during the patient's care in the ED.  Some ED evaluations and interventions may be delayed as a result of limited staffing during the pandemic.*  Note:  This document was prepared using Conservation officer, historic buildingsDragon voice recognition software  and may include unintentional dictation errors.   Gregor Hams, MD 10/22/18 [phone]

## 2018-10-22 NOTE — ED Triage Notes (Signed)
Patient punched a wall. Patient c/o right hand pain/swelling.

## 2019-02-27 ENCOUNTER — Encounter: Payer: Self-pay | Admitting: Emergency Medicine

## 2019-02-27 ENCOUNTER — Emergency Department: Payer: Self-pay

## 2019-02-27 ENCOUNTER — Other Ambulatory Visit: Payer: Self-pay

## 2019-02-27 ENCOUNTER — Emergency Department
Admission: EM | Admit: 2019-02-27 | Discharge: 2019-02-27 | Disposition: A | Discharge status: Home / Self Care | Payer: Self-pay | Attending: Emergency Medicine | Admitting: Emergency Medicine

## 2019-02-27 DIAGNOSIS — M79672 Pain in left foot: Secondary | ICD-10-CM | POA: Insufficient documentation

## 2019-02-27 DIAGNOSIS — F17200 Nicotine dependence, unspecified, uncomplicated: Secondary | ICD-10-CM | POA: Insufficient documentation

## 2019-02-27 NOTE — ED Triage Notes (Signed)
Pt reports pain to his left foot around his pinky toe starting Saturday and now pain is worse and it hurts to walk.

## 2019-02-27 NOTE — ED Notes (Signed)
See triage note  Presents with pain to left foot  States pain is mainly near 5 th toe

## 2019-02-27 NOTE — ED Provider Notes (Signed)
[facility] Emergency Department Provider Note  ____________________________________________   First MD Initiated Contact with Patient 02/27/19 1514     (approximate)  I have reviewed the triage vital signs and the nursing notes.   HISTORY  Chief Complaint Foot Pain    HPI Lance Hammond is a 28 y.o. male presents emergency department complaining of left foot pain.  No known injury.  States painful to bear weight.  States at work last night the pain got so bad he could hardly walk.  Pain is located around the lateral side of the left foot.  No numbness or tingling.    Past Medical History:  Diagnosis Date  . Acid reflux     There are no problems to display for this patient.   History reviewed. No pertinent surgical history.  Prior to Admission medications   Medication Sig Start Date End Date Taking? Authorizing Provider  naproxen (NAPROSYN) 500 MG tablet Take 1 tablet (500 mg total) by mouth 2 (two) times daily with a meal. 04/24/16   Triplett, Cari B, FNP    Allergies Amoxicillin  No family history on file.  Social History Social History   Tobacco Use  . Smoking status: Current Every Day Smoker    Packs/day: 0.50    Years: 6.00    Pack years: 3.00  . Smokeless tobacco: Never Used  Substance Use Topics  . Alcohol use: Yes    Comment: occasional  . Drug use: Yes    Types: Marijuana    Comment: Daily     Review of Systems  Constitutional: No fever/chills Eyes: No visual changes. ENT: No sore throat. Respiratory: Denies cough Genitourinary: Negative for dysuria. Musculoskeletal: Negative for back pain.  Positive for left foot pain Skin: Negative for rash.    ____________________________________________   PHYSICAL EXAM:  VITAL SIGNS: ED Triage Vitals  Enc Vitals Group     BP 02/27/19 1523 135/78     Pulse Rate 02/27/19 1523 80     Resp 02/27/19 1523 18     Temp 02/27/19 1523 98 F (36.7 C)     Temp Source  02/27/19 1523 Oral     SpO2 02/27/19 1523 99 %     Weight 02/27/19 1352 154 lb (69.9 kg)     Height 02/27/19 1352 6' (1.829 m)     Head Circumference --      Peak Flow --      Pain Score 02/27/19 1351 8     Pain Loc --      Pain Edu? --      Excl. in [place]? --     Constitutional: Alert and oriented. Well appearing and in no acute distress. Eyes: Conjunctivae are normal.  Head: Atraumatic. Nose: No congestion/rhinnorhea. Mouth/Throat: Mucous membranes are moist.   Neck:  supple no lymphadenopathy noted Cardiovascular: Normal rate, regular rhythm.  Respiratory: normal respiratory effort.  No retractions,   GU: deferred Musculoskeletal: FROM all extremities, warm and well perfused lateral side of left foot tender along the 5th metatarsal, Neurologic:  Normal speech and language.  Skin:  Skin is warm, dry and intact. No rash noted. Psychiatric: Mood and affect are normal. Speech and behavior are normal.  ____________________________________________   LABS (all labs ordered are listed, but only abnormal results are displayed)  Labs Reviewed - No data to display ____________________________________________   ____________________________________________  RADIOLOGY  Odette Horns of left foot does not show a fracture  ____________________________________________   PROCEDURES  Procedure(s) performed: ace  wrap and wooden shoe applied by the tech   Procedures    ____________________________________________   INITIAL IMPRESSION / ASSESSMENT AND PLAN / ED COURSE  Pertinent labs & imaging results that were available during my care of the patient were reviewed by me and considered in my medical decision making (see chart for details).   Patient is a 28 year old male presents emergency department complaint of left foot pain.  See HPI  Physical exam shows the left foot to be tender along the fifth metatarsal.  X-ray of the left foot  X-ray is negative  Explained the results to  the patient.  He was placed in a postop shoe and Ace wrap by the tech.  Please take over-the-counter Tylenol or ibuprofen.  Is given a work note for today and tomorrow so he can rest the foot.  Return emergency department worsening.  States he understands will comply.  Is discharged stable condition.   Lance Hammond was evaluated in Emergency Department on 02/27/2019 for the symptoms described in the history of present illness. He was evaluated in the context of the global COVID-19 pandemic, which necessitated consideration that the patient might be at risk for infection with the SARS-CoV-2 virus that causes COVID-19. Institutional protocols and algorithms that pertain to the evaluation of patients at risk for COVID-19 are in a state of rapid change based on information released by regulatory bodies including the CDC and federal and state organizations. These policies and algorithms were followed during the patient's care in the ED.   As part of my medical decision making, I reviewed the following data within the electronic MEDICAL RECORD NUMBER Nursing notes reviewed and incorporated, Old chart reviewed, Radiograph reviewed , Notes from prior ED visits and [place] Controlled Substance Database  ____________________________________________   FINAL CLINICAL IMPRESSION(S) / ED DIAGNOSES  Final diagnoses:  Foot pain, left      NEW MEDICATIONS STARTED DURING THIS VISIT:  New Prescriptions   No medications on file     Note:  This document was prepared using Dragon voice recognition software and may include unintentional dictation errors.    Faythe Ghee, PA-C 02/27/19 1612    Sharman Cheek, MD 02/27/19 [phone]

## 2019-09-10 ENCOUNTER — Other Ambulatory Visit: Payer: Self-pay

## 2019-09-10 ENCOUNTER — Emergency Department
Admission: EM | Admit: 2019-09-10 | Discharge: 2019-09-10 | Disposition: A | Discharge status: Home / Self Care | Payer: No Typology Code available for payment source | Attending: Emergency Medicine | Admitting: Emergency Medicine

## 2019-09-10 ENCOUNTER — Encounter: Payer: Self-pay | Admitting: Emergency Medicine

## 2019-09-10 ENCOUNTER — Emergency Department: Payer: No Typology Code available for payment source

## 2019-09-10 DIAGNOSIS — R0781 Pleurodynia: Secondary | ICD-10-CM | POA: Insufficient documentation

## 2019-09-10 DIAGNOSIS — F17200 Nicotine dependence, unspecified, uncomplicated: Secondary | ICD-10-CM | POA: Diagnosis not present

## 2019-09-10 DIAGNOSIS — Y939 Activity, unspecified: Secondary | ICD-10-CM | POA: Insufficient documentation

## 2019-09-10 DIAGNOSIS — Y999 Unspecified external cause status: Secondary | ICD-10-CM | POA: Insufficient documentation

## 2019-09-10 DIAGNOSIS — Y92410 Unspecified street and highway as the place of occurrence of the external cause: Secondary | ICD-10-CM | POA: Insufficient documentation

## 2019-09-10 DIAGNOSIS — M7918 Myalgia, other site: Secondary | ICD-10-CM

## 2019-09-10 DIAGNOSIS — M25511 Pain in right shoulder: Secondary | ICD-10-CM | POA: Diagnosis not present

## 2019-09-10 MED ORDER — CYCLOBENZAPRINE HCL 10 MG PO TABS: 10.0000 mg | ORAL_TABLET | Freq: Three times a day (TID) | ORAL | 0 refills | Status: DC | PRN

## 2019-09-10 MED ORDER — LIDOCAINE 5 % EX PTCH
1.0000 | MEDICATED_PATCH | CUTANEOUS | Status: DC
  Administered 2019-09-10: 1 via TRANSDERMAL
  Filled 2019-09-10: qty 1

## 2019-09-10 MED ORDER — IBUPROFEN 800 MG PO TABS: 800.0000 mg | ORAL_TABLET | Freq: Three times a day (TID) | ORAL | 0 refills | Status: DC | PRN
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
## 2019-09-10 NOTE — Discharge Instructions (Signed)
Follow discharge care instructions using heat instead of cold.
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
## 2019-10-02 ENCOUNTER — Telehealth: Payer: Self-pay | Admitting: General Practice

## 2019-10-02 NOTE — Telephone Encounter (Signed)
Individual has been contacted 3+ times regarding ED referral. No further attempts to contact individual will be made. 

## 2021-03-02 ENCOUNTER — Emergency Department
Admission: EM | Admit: 2021-03-02 | Discharge: 2021-03-02 | Disposition: A | Discharge status: Home / Self Care | Payer: Self-pay | Attending: Emergency Medicine | Admitting: Emergency Medicine

## 2021-03-02 ENCOUNTER — Encounter: Payer: Self-pay | Admitting: Emergency Medicine

## 2021-03-02 ENCOUNTER — Other Ambulatory Visit: Payer: Self-pay

## 2021-03-02 DIAGNOSIS — F17210 Nicotine dependence, cigarettes, uncomplicated: Secondary | ICD-10-CM | POA: Insufficient documentation

## 2021-03-02 DIAGNOSIS — M545 Low back pain, unspecified: Secondary | ICD-10-CM | POA: Insufficient documentation

## 2021-03-02 LAB — URINALYSIS, COMPLETE (UACMP) WITH MICROSCOPIC
Bacteria, UA: NONE SEEN
Bilirubin Urine: NEGATIVE
Glucose, UA: NEGATIVE mg/dL
Hgb urine dipstick: NEGATIVE
Ketones, ur: NEGATIVE mg/dL
Leukocytes,Ua: NEGATIVE
Nitrite: NEGATIVE
Protein, ur: NEGATIVE mg/dL
Specific Gravity, Urine: 1.028 (ref 1.005–1.030)
Squamous Epithelial / LPF: NONE SEEN (ref 0–5)
pH: 6 (ref 5.0–8.0)

## 2021-03-02 MED ORDER — IBUPROFEN 400 MG PO TABS
400.0000 mg | ORAL_TABLET | Freq: Once | ORAL | Status: AC
  Administered 2021-03-02: 12:00:00 400 mg via ORAL
  Filled 2021-03-02: qty 1

## 2021-03-02 NOTE — ED Provider Notes (Signed)
[facility]  ____________________________________________   Event Date/Time   First MD Initiated Contact with Patient 03/02/21 1042     (approximate)  I have reviewed the triage vital signs and the nursing notes.   HISTORY  Chief Complaint Back Pain (/)    HPI Lance Hammond is a 30 y.o. male with no significant past medical history who presents with back pain.  Symptoms started several days ago.  Pain is located in the right lower back as well as the right flank.  It is a constant pain.  No significant exacerbating or alleviating factors.  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  He denies any preceding injury.  Denies fevers, chills, urinary symptoms.  No history of kidney stones.  No radiation of symptoms down the leg.  No history of IV drug use.  No difficulty urinating.  Has tried Tylenol for it.         Past Medical History:  Diagnosis Date   Acid reflux     There are no problems to display for this patient.   History reviewed. No pertinent surgical history.  Prior to Admission medications   Medication Sig Start Date End Date Taking? Authorizing Provider  cyclobenzaprine (FLEXERIL) 10 MG tablet Take 1 tablet (10 mg total) by mouth 3 (three) times daily as needed. 09/10/19   Sable Feil, PA-C  ibuprofen (ADVIL) 800 MG tablet Take 1 tablet (800 mg total) by mouth every 8 (eight) hours as needed for moderate pain. 09/10/19   Sable Feil, PA-C    Allergies Amoxicillin  No family history on file.  Social History Social History   Tobacco Use   Smoking status: Every Day    Packs/day: 0.50    Years: 6.00    Pack years: 3.00    Types: Cigarettes   Smokeless tobacco: Never  Substance Use Topics   Alcohol use: Yes    Comment: occasional   Drug use: Yes    Types: Marijuana    Comment: Daily     Review of Systems   Review of Systems  Constitutional:  Negative for chills and fever.   Respiratory:  Negative for shortness of breath.   Cardiovascular:  Negative for chest pain.  Gastrointestinal:  Negative for abdominal pain.  Genitourinary:  Positive for flank pain. Negative for dysuria and hematuria.  Musculoskeletal:  Positive for back pain.  All other systems reviewed and are negative.  Physical Exam Updated Vital Signs BP 130/74 (BP Location: Left Arm)    Pulse 74    Temp 98.8 F (37.1 C)    Resp 16    Ht 6\' 2"  (1.88 m)    Wt 72.6 kg    SpO2 99%    BMI 20.55 kg/m   Physical Exam Vitals and nursing note reviewed.  Constitutional:      General: He is not in acute distress.    Appearance: Normal appearance.  HENT:     Head: Normocephalic and atraumatic.  Eyes:     General: No scleral icterus.    Conjunctiva/sclera: Conjunctivae normal.  Pulmonary:     Effort: Pulmonary effort is normal. No respiratory distress.     Breath sounds: Normal breath sounds. No wheezing.  Abdominal:     Tenderness: There is no right CVA tenderness.  Musculoskeletal:        General: No deformity or signs of injury.     Cervical back: Normal range of motion.  Comments: Mild tenderness to palpation in the lumbar midline and right SI joint, no significant CVA tenderness or thoracic spine tenderness  Skin:    Coloration: Skin is not jaundiced or pale.  Neurological:     General: No focal deficit present.     Mental Status: He is alert and oriented to person, place, and time. Mental status is at baseline.  Psychiatric:        Mood and Affect: Mood normal.        Behavior: Behavior normal.     LABS (all labs ordered are listed, but only abnormal results are displayed)  Labs Reviewed  URINALYSIS, COMPLETE (UACMP) WITH MICROSCOPIC - Abnormal; Notable for the following components:      Result Value   Color, Urine YELLOW (*)    APPearance CLEAR (*)    All other components within normal limits    ____________________________________________  EKG   ____________________________________________  RADIOLOGY Ky Barban, personally viewed and evaluated these images (plain radiographs) as part of my medical decision making, as well as reviewing the written report by the radiologist.  ED MD interpretation:      ____________________________________________   PROCEDURES  Procedure(s) performed (including Critical Care):  Procedures   ____________________________________________   INITIAL IMPRESSION / ASSESSMENT AND PLAN / ED COURSE     Patient is a 30 year old male presents with right lower back pain/flank pain x4 days.  There are no associated symptoms.  He has no radiation of pain, no red flag symptoms including numbness, weakness, IV drug use, fevers or urinary symptoms.  He is tender in the right SI joint and lumbar midline.  We will obtain a urine to rule out UTI or significant hematuria to suggest stone however suspect this is musculoskeletal.  Will treat supportively with NSAIDs.  UA not consistent with infection and no RBCs.      ____________________________________________   FINAL CLINICAL IMPRESSION(S) / ED DIAGNOSES  Final diagnoses:  Right-sided low back pain without sciatica, unspecified chronicity     ED Discharge Orders     None        Note:  This document was prepared using Dragon voice recognition software and may include unintentional dictation errors.    Georga Hacking, MD 03/02/21 [phone]

## 2021-03-02 NOTE — ED Triage Notes (Signed)
Presents with right lower back /flank pain for couple of days  min relief with IBU and heat  denies any n/v or urinary sxs

## 2021-03-02 NOTE — ED Notes (Signed)
EDP assessed pt at bedside and will discharge pt after UA.

## 2021-08-08 IMAGING — DX DG FOOT COMPLETE 3+V*L*
3 series · 3 of 3 positions shown · non-contrast
Comparison: None.

CLINICAL DATA: Pain to left foot and pinky toe began on [REDACTED],
now worsening, no known injury

EXAM:
LEFT FOOT - COMPLETE 3+ VIEW

[foot ap]
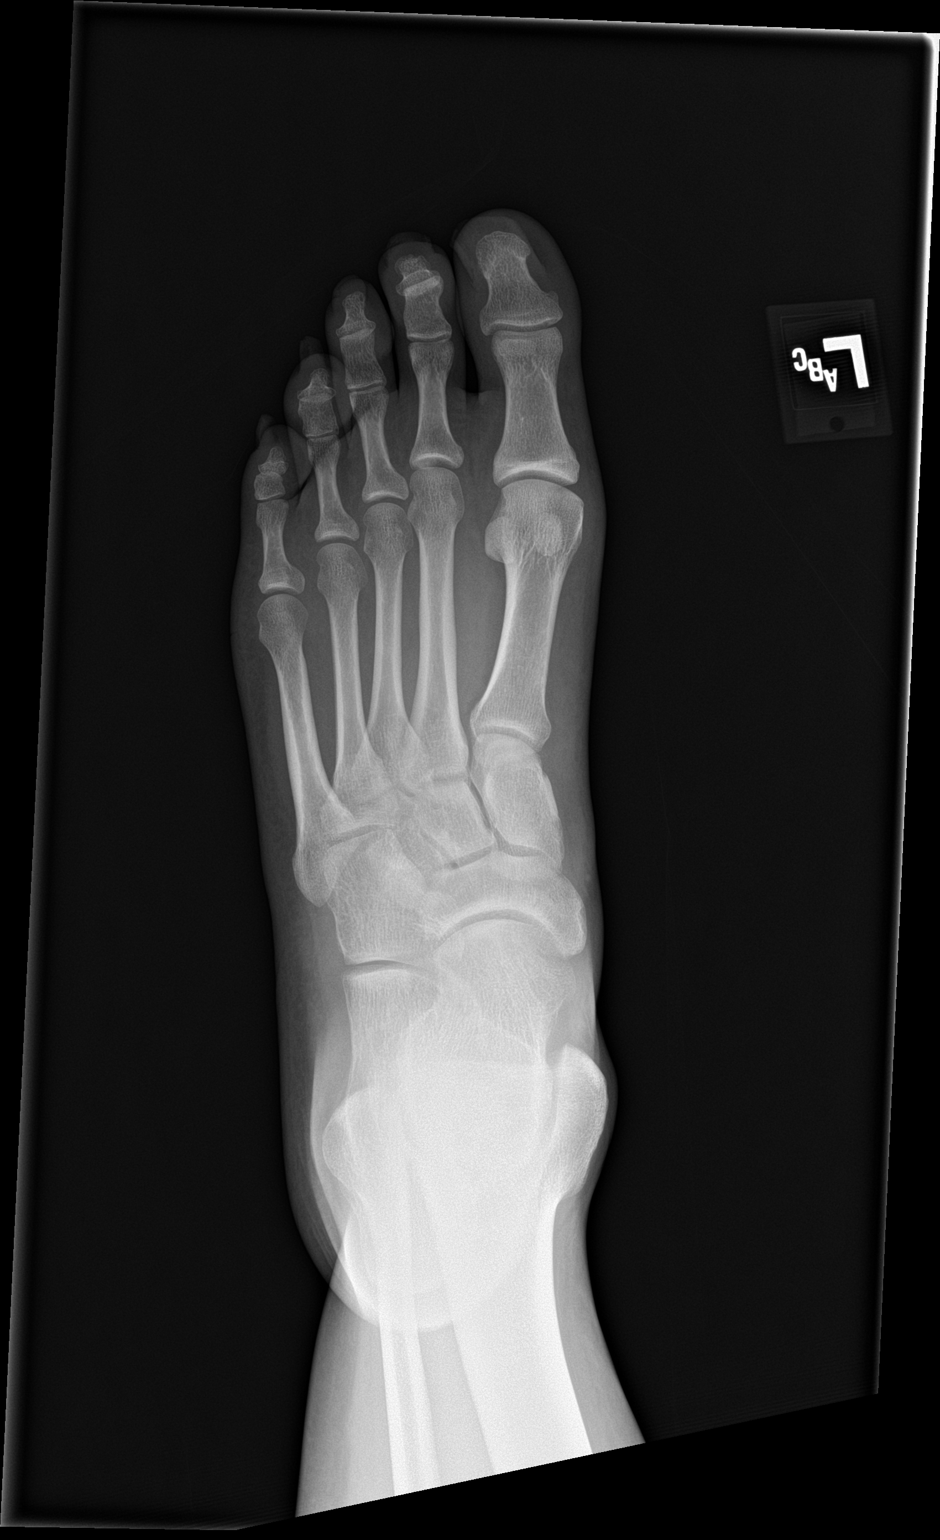

[foot obl]
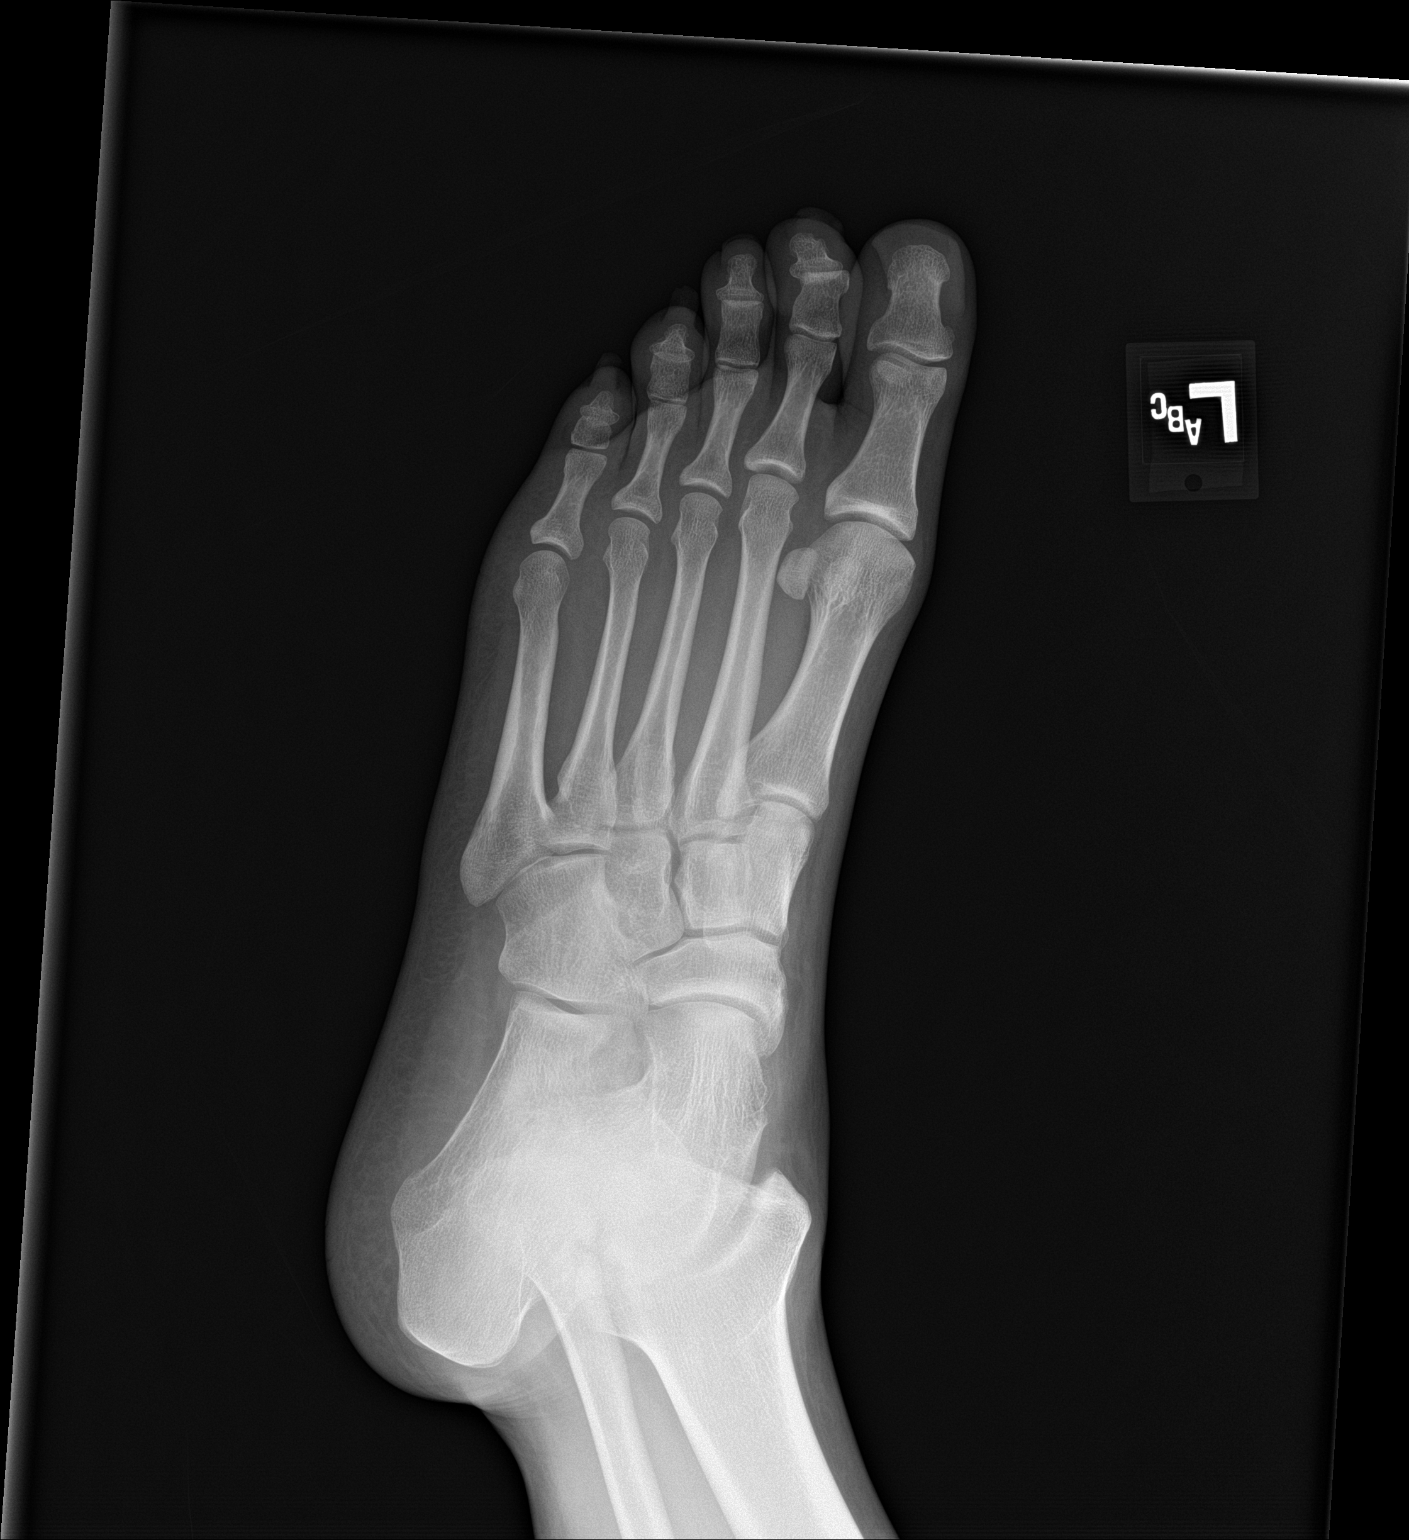

[foot lat]
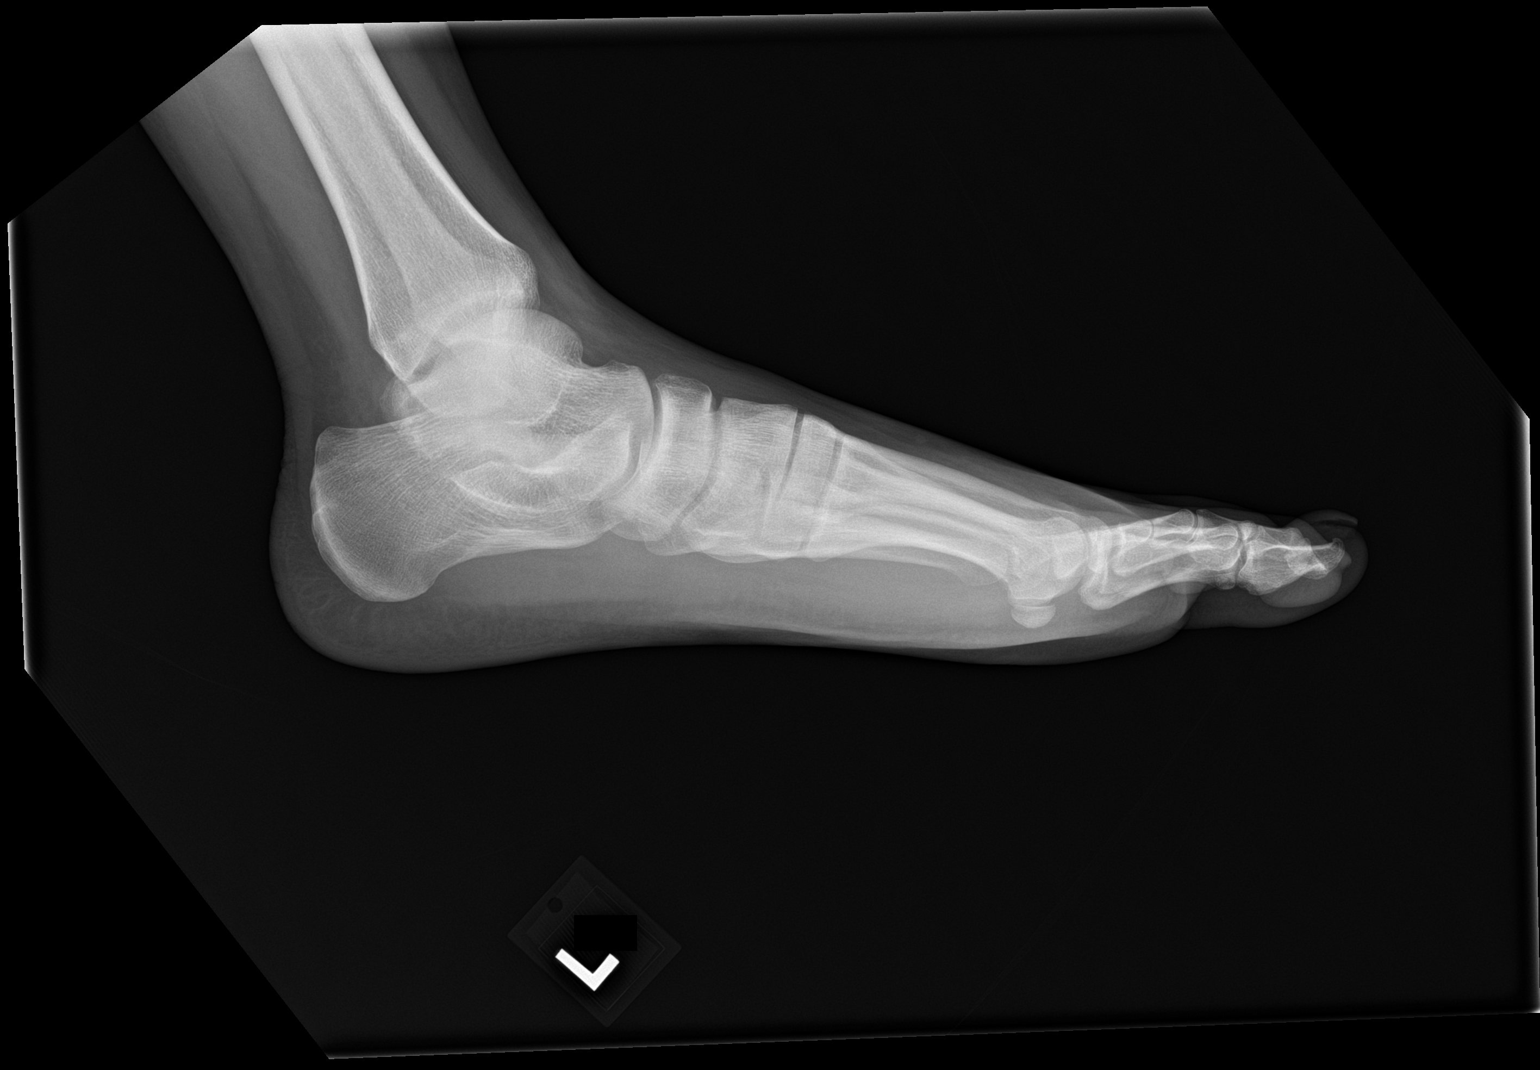

[3 of 3 positions shown; findings below may reference images not displayed]

FINDINGS: No acute bony abnormality. Specifically, no fracture, subluxation,
or dislocation. Appearance of mild pes planus, incompletely assessed
on nonweightbearing radiograph. Soft tissues are unremarkable. There
is some mild soft tissue lucency focally at the base of the fifth
middle phalanx/PIP.
IMPRESSION: Irregular soft tissue lucency along the lateral aspect of the fifth
PIP/middle phalanx. Could reflect small amount of soft tissue gas.
No acute osseous abnormality.

## 2021-08-17 ENCOUNTER — Other Ambulatory Visit: Payer: Self-pay

## 2021-08-17 ENCOUNTER — Emergency Department
Admission: EM | Admit: 2021-08-17 | Discharge: 2021-08-17 | Disposition: A | Discharge status: Home / Self Care | Payer: Federal, State, Local not specified - PPO | Attending: Emergency Medicine | Admitting: Emergency Medicine

## 2021-08-17 ENCOUNTER — Emergency Department: Payer: Federal, State, Local not specified - PPO

## 2021-08-17 DIAGNOSIS — X58XXXA Exposure to other specified factors, initial encounter: Secondary | ICD-10-CM | POA: Insufficient documentation

## 2021-08-17 DIAGNOSIS — T17208A Unspecified foreign body in pharynx causing other injury, initial encounter: Secondary | ICD-10-CM

## 2021-08-17 DIAGNOSIS — T17228A Food in pharynx causing other injury, initial encounter: Secondary | ICD-10-CM | POA: Diagnosis not present

## 2021-08-17 NOTE — ED Triage Notes (Signed)
Pt states after eating fish on Saturday had the sensation of a bone stuck in his throat. Pt states "it is irritating". Pt without distress noted, no drooling, able to speak in clear sentences.

## 2021-08-17 NOTE — ED Provider Notes (Signed)
[facility] Provider Note    Event Date/Time   First MD Initiated Contact with Patient 08/17/21 [phone]     (approximate)   History   Chief Complaint foreign body in throat   HPI  Lance Hammond is a 31 y.o. male with past medical history of GERD who presents to the ED complaining of foreign body in his throat.  Patient reports that 2 days ago he had eaten fish for dinner and about 30 minutes later began to feel that sensation of something stuck in his upper throat.  He points to the area of his thyroid cartilage, where he has discomfort when swallowing or laying flat.  He denies any difficulty breathing and has been able to eat and drink normally since then.  He has not had any nausea or vomiting.  He also has braces in place but has had multiple spots where the brackets have become loose and he is concerned he may have swallowed a wire.     Physical Exam   Triage Vital Signs: ED Triage Vitals  Enc Vitals Group     BP 08/17/21 0448 131/86     Pulse Rate 08/17/21 0448 73     Resp 08/17/21 0448 16     Temp 08/17/21 0448 98.4 F (36.9 C)     Temp Source 08/17/21 0448 Oral     SpO2 08/17/21 0448 97 %     Weight 08/17/21 0449 160 lb (72.6 kg)     Height 08/17/21 0449 6' (1.829 m)     Head Circumference --      Peak Flow --      Pain Score 08/17/21 0449 2     Pain Loc --      Pain Edu? --      Excl. in [place]? --     Most recent vital signs: Vitals:   08/17/21 0448 08/17/21 0802  BP: 131/86 103/62  Pulse: 73 77  Resp: 16 16  Temp: 98.4 F (36.9 C)   SpO2: 97% 100%    Constitutional: Alert and oriented. Eyes: Conjunctivae are normal. Head: Atraumatic. Nose: No congestion/rhinnorhea. Mouth/Throat: Mucous membranes are moist.  Oropharynx clear with no erythema, edema, or foreign body noted. Neck: No anterior edema or tenderness. Cardiovascular: Normal rate, regular rhythm. Grossly normal heart sounds.  2+ radial pulses  bilaterally. Respiratory: Normal respiratory effort.  No retractions. Lungs CTAB. Gastrointestinal: Soft and nontender. No distention. Musculoskeletal: No lower extremity tenderness nor edema.  Neurologic:  Normal speech and language. No gross focal neurologic deficits are appreciated.    ED Results / Procedures / Treatments   Labs (all labs ordered are listed, but only abnormal results are displayed) Labs Reviewed - No data to display  RADIOLOGY Neck x-ray reviewed and interpreted by me with linear foreign body noted in the area of vallecula.  PROCEDURES:  Critical Care performed: No  Procedures   MEDICATIONS ORDERED IN ED: Medications - No data to display   IMPRESSION / MDM / [place] / ED COURSE  I reviewed the triage vital signs and the nursing notes.                              31 y.o. male with past medical history of GERD who presents to the ED complaining of foreign body sensation in his upper throat for the past 2 days that causes discomfort when he tries to swallow or lay  flat.  Patient's presentation is most consistent with acute presentation with potential threat to life or bodily function.  Differential diagnosis includes, but is not limited to, foreign body, esophageal perforation, airway obstruction, globus sensation.  Patient well-appearing and in no acute distress, vital signs are unremarkable and he is breathing comfortably.  No foreign body visible on examination of his throat, however x-ray does show linear density in the area of his vallecula concerning for potential fishbone.  He is not having any difficulty swallowing his oral secretions and no evidence of airway obstruction, plan to discuss potential intervention with ENT.  Case discussed with Dr. Kathyrn Sheriff of ENT, who recommends close outpatient follow-up given patient tolerating oral secretions, breathing and swallowing without difficulty.  Patient to be seen in the ENT clinic in 1 to 2  days, was counseled to return to the ED for new worsening symptoms.  Patient agrees with plan.      FINAL CLINICAL IMPRESSION(S) / ED DIAGNOSES   Final diagnoses:  Foreign body in pharynx, initial encounter     Rx / DC Orders   ED Discharge Orders     None        Note:  This document was prepared using Dragon voice recognition software and may include unintentional dictation errors.   Blake Divine, MD 08/17/21 [phone]

## 2021-08-17 NOTE — ED Notes (Signed)
Pt a/o, no c/o sob. Pt informed not to eat or drink until notified by staff.

## 2022-01-16 ENCOUNTER — Ambulatory Visit
Admission: EM | Admit: 2022-01-16 | Discharge: 2022-01-16 | Disposition: A | Discharge status: Home / Self Care | Payer: Federal, State, Local not specified - PPO | Attending: Family Medicine | Admitting: Family Medicine

## 2022-01-16 ENCOUNTER — Encounter: Payer: Self-pay | Admitting: Emergency Medicine

## 2022-01-16 DIAGNOSIS — U071 COVID-19: Secondary | ICD-10-CM

## 2022-01-16 NOTE — ED Provider Notes (Signed)
[facility]    CSN: [id number] Arrival date & time: 01/16/22  0908      History   Chief Complaint Chief Complaint  Patient presents with   Covid Positive    HPI Lance Hammond is a 31 y.o. male.   HPI   Lance presents for after testing positive for COVID at home on Tuesday.  States that his son tested positive for COVID on Monday.  He had a cough and nasal congestion but the cough is gone now.  He took some over-the-counter medications with relief.  States that his job told him he needed to get a note to come back to work.  He is not having fevers, sore throat, diarrhea, vomiting, belly pain, nausea, chest pressure, shortness of breath or headache.      Past Medical History:  Diagnosis Date   Acid reflux     There are no problems to display for this patient.   History reviewed. No pertinent surgical history.     Home Medications    Prior to Admission medications   Medication Sig Start Date End Date Taking? Authorizing Provider  cyclobenzaprine (FLEXERIL) 10 MG tablet Take 1 tablet (10 mg total) by mouth 3 (three) times daily as needed. 09/10/19   Sable Feil, PA-C  ibuprofen (ADVIL) 800 MG tablet Take 1 tablet (800 mg total) by mouth every 8 (eight) hours as needed for moderate pain. 09/10/19   Sable Feil, PA-C    Family History History reviewed. No pertinent family history.  Social History Social History   Tobacco Use   Smoking status: Every Day    Packs/day: 0.50    Years: 6.00    Total pack years: 3.00    Types: Cigarettes   Smokeless tobacco: Never  Vaping Use   Vaping Use: Never used  Substance Use Topics   Alcohol use: Yes    Comment: occasional   Drug use: Yes    Types: Marijuana    Comment: Daily      Allergies   Amoxicillin   Review of Systems Review of Systems: negative unless otherwise stated in HPI.      Physical Exam Triage Vital Signs ED Triage Vitals  Enc Vitals Group     BP      Pulse       Resp      Temp      Temp src      SpO2      Weight      Height      Head Circumference      Peak Flow      Pain Score      Pain Loc      Pain Edu?      Excl. in [place]?    No data found.  Updated Vital Signs BP 115/80 (BP Location: Right Arm)   Pulse 65   Temp 98 F (36.7 C) (Oral)   Resp 15   Ht 6' (1.829 m)   Wt 74.8 kg   SpO2 96%   BMI 22.38 kg/m   Visual Acuity Right Eye Distance:   Left Eye Distance:   Bilateral Distance:    Right Eye Near:   Left Eye Near:    Bilateral Near:     Physical Exam GEN:     alert, non-toxic appearing male in no distress     HENT:  mucus membranes moist, oropharyngeal  without lesions or  exudate, no  tonsillar hypertrophy, no oropharyngeal erythema ,  no nasal discharge,  bilateral TM  normal EYES:   pupils equal and reactive, EOMi ,  no scleral injection NECK:  normal ROM, no  lymphadenopathy,  no meningismus   RESP:  no increased work of breathing,  clear to auscultation bilaterally CVS:   regular rate  and rhythm Skin:   warm and dry, no rash on visible skin , normal  skin turgor    UC Treatments / Results  Labs (all labs ordered are listed, but only abnormal results are displayed) Labs Reviewed - No data to display  EKG   Radiology No results found.  Procedures Procedures (including critical care time)  Medications Ordered in UC Medications - No data to display  Initial Impression / Assessment and Plan / UC Course  I have reviewed the triage vital signs and the nursing notes.  Pertinent labs & imaging results that were available during my care of the patient were reviewed by me and considered in my medical decision making (see chart for details).        Patient is a 31 y.o. male with history of acid reflux who presents after testing positive for at home on Tuesday.  He is wanting to go back to work.  He still has rhinorrhea but otherwise he is well-appearing, well-hydrated and in no respiratory distress.  No  imaging or treatment needed for COVID.  Discussed that antibiotics are not needed for his runny nose.  Reviewed quarantine instructions and when to wear a mask out in public.  On chart review, he had a negative COVID PCR 3 years ago at an outside facility.  I do not see a current positive result in the chart.  Discussed MDM, treatment plan and plan for follow-up with patient/parent who agrees with plan.      Final Clinical Impressions(s) / UC Diagnoses   Final diagnoses:  COVID-19     Discharge Instructions      Your home test for COVID-19 was positive, meaning that you were infected with the novel coronavirus and could give the germ to others.  Please continue isolation at home for at least 5 days since the start of your symptoms. Once you complete your 5 day quarantine, you may return to normal activities as long as you've not had a fever for over 24 hours(without taking fever reducing medicine) and your symptoms are improving. Be sure to wear a mask until Day 11.   Please continue good preventive care measures, including:  frequent hand-washing, avoid touching your face, cover coughs/sneezes, stay out of crowds and keep a 6 foot distance from others.  Go to the nearest hospital emergency room if fever/cough/breathlessness are severe or illness seems like a threat to life.      ED Prescriptions   None    PDMP not reviewed this encounter.   Katha Cabal, DO 01/16/22 1302

## 2022-01-16 NOTE — ED Triage Notes (Signed)
Patient took home covid test on Tuesday and was positive.  Patient c/o cough and HA that started on Monday.  Patient denies fevers.

## 2022-01-16 NOTE — Discharge Instructions (Signed)
Your home test for COVID-19 was positive, meaning that you were infected with the novel coronavirus and could give the germ to others.  Please continue isolation at home for at least 5 days since the start of your symptoms. Once you complete your 5 day quarantine, you may return to normal activities as long as you've not had a fever for over 24 hours(without taking fever reducing medicine) and your symptoms are improving. Be sure to wear a mask until Day 11.   Please continue good preventive care measures, including:  frequent hand-washing, avoid touching your face, cover coughs/sneezes, stay out of crowds and keep a 6 foot distance from others.  Go to the nearest hospital emergency room if fever/cough/breathlessness are severe or illness seems like a threat to life.  
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
## 2022-06-18 ENCOUNTER — Emergency Department
Admission: EM | Admit: 2022-06-18 | Discharge: 2022-06-19 | Disposition: A | Discharge status: Home / Self Care | Payer: Federal, State, Local not specified - PPO | Attending: Emergency Medicine | Admitting: Emergency Medicine

## 2022-06-18 ENCOUNTER — Emergency Department: Payer: Federal, State, Local not specified - PPO

## 2022-06-18 DIAGNOSIS — M25562 Pain in left knee: Secondary | ICD-10-CM | POA: Diagnosis present

## 2022-06-18 DIAGNOSIS — M7122 Synovial cyst of popliteal space [Baker], left knee: Secondary | ICD-10-CM | POA: Insufficient documentation

## 2022-06-18 MED ORDER — MELOXICAM 15 MG PO TABS: 15.0000 mg | ORAL_TABLET | Freq: Every day | ORAL | 0 refills | Status: AC

## 2022-06-18 MED ORDER — ONDANSETRON 4 MG PO TBDP
4.0000 mg | ORAL_TABLET | Freq: Once | ORAL | Status: AC
  Administered 2022-06-18: 4 mg via ORAL
  Filled 2022-06-18: qty 1

## 2022-06-18 NOTE — Discharge Instructions (Signed)
Please rest ice and elevate the left knee.  Take meloxicam daily as needed for pain for 2 weeks.  You may use Tylenol for additional pain relief.  Do not take Aleve ibuprofen or Excedrin with meloxicam.  Call orthopedic office in 1 to 2 weeks if no improvement.

## 2022-06-18 NOTE — ED Provider Notes (Signed)
[place] [facility] Provider Note   CSN: [id number] Arrival date & time: 06/18/22  2044     History  Chief Complaint  Patient presents with   Knee Pain    Lance Hammond is a 32 y.o. male presents to the emergency department for evaluation of left knee pain.  Patient states he was at work lifting something heavy when he felt something pop along the posterior lateral aspect of his left knee.  He developed a little bit of swelling behind the knee but no pain or swelling throughout the calf or lower leg.  He has been ambulatory with slight limp.  With ice and elevation pain and swelling has improved.  He has taken Excedrin with relief he has been ambulatory.  HPI     Home Medications Prior to Admission medications   Medication Sig Start Date End Date Taking? Authorizing Provider  meloxicam (MOBIC) 15 MG tablet Take 1 tablet (15 mg total) by mouth daily for 15 days. 06/18/22 07/03/22 Yes Evon Slack, PA-C  cyclobenzaprine (FLEXERIL) 10 MG tablet Take 1 tablet (10 mg total) by mouth 3 (three) times daily as needed. 09/10/19   Joni Reining, PA-C  ibuprofen (ADVIL) 800 MG tablet Take 1 tablet (800 mg total) by mouth every 8 (eight) hours as needed for moderate pain. 09/10/19   Joni Reining, PA-C      Allergies    Amoxicillin    Review of Systems   Review of Systems  Physical Exam Updated Vital Signs BP 125/78   Pulse 74   Temp 97.6 F (36.4 C)   Resp 18   Ht 6' (1.829 m)   Wt 72.6 kg   SpO2 100%   BMI 21.70 kg/m  Physical Exam Constitutional:      Appearance: He is well-developed.  HENT:     Head: Normocephalic and atraumatic.  Eyes:     Conjunctiva/sclera: Conjunctivae normal.  Cardiovascular:     Rate and Rhythm: Normal rate.  Pulmonary:     Effort: Pulmonary effort is normal. No respiratory distress.  Musculoskeletal:        General: Normal range of motion.     Cervical back: Normal range of motion.     Comments: Left  lower extremity with normal range of motion of the hip with internal ex rotation with no discomfort.  Left knee with no effusion.  Small palpable Baker's cyst noted with no warmth or redness.  No swelling edema throughout the left leg.  Negative Homans' sign bilaterally.  He is neuro vas intact with normal ankle plantarflexion dorsiflexion.  Left knee shows no effusion, no ligamentous laxity with valgus or varus stress testing no laxity with anterior or posterior drawer test.  Patella tracks well.  Extensor mechanism is intact.  Negative medial/lateral McMurray's test.  Patient ambulates with no antalgic gait no assistive devices  Skin:    General: Skin is warm.     Findings: No rash.  Neurological:     Mental Status: He is alert and oriented to person, place, and time.  Psychiatric:        Behavior: Behavior normal.        Thought Content: Thought content normal.     ED Results / Procedures / Treatments   Labs (all labs ordered are listed, but only abnormal results are displayed) Labs Reviewed - No data to display  EKG None  Radiology DG Knee Complete 4 Views Left  Result Date: 06/18/2022 CLINICAL  DATA:  Left knee pain after feeling a pop EXAM: LEFT KNEE - COMPLETE 4+ VIEW COMPARISON:  None Available. FINDINGS: No evidence of fracture, dislocation, or joint effusion. No evidence of arthropathy or other focal bone abnormality. Soft tissues are unremarkable. IMPRESSION: Negative. Electronically Signed   By: Minerva Festeryler  Stutzman M.D.   On: 06/18/2022 23:32    Procedures Procedures    Medications Ordered in ED Medications  ondansetron (ZOFRAN-ODT) disintegrating tablet 4 mg (4 mg Oral Given 06/18/22 2325)    ED Course/ Medical Decision Making/ A&P                             Medical Decision Making Amount and/or Complexity of Data Reviewed Radiology: ordered.  Risk Prescription drug management.   32 year old male with acute left knee pain.  Felt a pop posterior laterally.  No  mechanical symptoms on exam and has no ligamentous laxity.  No effusion.  Small palpable Baker's cyst but no swelling or edema throughout the left leg.  No signs of DVT.  Vital signs are stable, afebrile.  Symptoms have significant improved with elevation and ice.  Offered knee brace, crutches but patient feels as if he does not need these.  He will take meloxicam daily for 2 weeks as needed and rest ice and elevate the knee and follow-up with orthopedics if no improvement.  He understands signs symptoms return to the ER for. Final Clinical Impression(s) / ED Diagnoses Final diagnoses:  Acute pain of left knee  Synovial cyst of left popliteal space    Rx / DC Orders ED Discharge Orders          Ordered    meloxicam (MOBIC) 15 MG tablet  Daily        06/18/22 2342              Ronnette JuniperGaines, Krina Mraz C, PA-C 06/18/22 2345    Pilar JarvisWong, Silas, MD 06/19/22 [phone]

## 2022-06-18 NOTE — ED Triage Notes (Signed)
Pt states he was at work lifting some heavy equipment and felt his left knee pop, pt states he now feels a lump on the back side of his knee and has pain when ambulating.

## 2022-06-30 ENCOUNTER — Ambulatory Visit: Payer: Self-pay | Admitting: *Deleted

## 2022-06-30 NOTE — Telephone Encounter (Signed)
  Chief Complaint: new patient appt. Abdominal pain Symptoms: mild pain now was seen in ED and told to get PCP Frequency: na Pertinent Negatives: Patient denies severe abdominal pain now. No fever, no N/V. No diarrhea  Disposition: ED /[] Urgent Care (no appt availability in office) / Appointment(In office/virtual)/  [facility]/ Home Care/ Refused Recommended Disposition /[] [facility]/  Follow-up with PCP Additional Notes:   Requesting to schedule new patient appt. Patient reports he has contacted [employer] to see if in network and reports yes practice is and accepts practice. On waitlist for earlier new patient appt. Than May 20. Please advise  recommended if sx worsen go back to ED.     Reason for Disposition  [1] MILD-MODERATE pain AND [2] constant and [3] present < 2 hours  Answer Assessment - Initial Assessment Questions 1. LOCATION: "Where does it hurt?"      Na  2. RADIATION: "Does the pain shoot anywhere else?" (e.g., chest, back)     na 3. ONSET: "When did the pain begin?" (Minutes, hours or days ago)      Seen in ED today  4. SUDDEN: "Gradual or sudden onset?"     na 5. PATTERN "Does the pain come and go, or is it constant?"    - If it comes and goes: "How long does it last?" "Do you have pain now?"     (Note: Comes and goes means the pain is intermittent. It goes away completely between bouts.)    - If constant: "Is it getting better, staying the same, or getting worse?"      (Note: Constant means the pain never goes away completely; most serious pain is constant and gets worse.)      Not as bad now  6. SEVERITY: "How bad is the pain?"  (e.g., Scale 1-10; mild, moderate, or severe)    - MILD (1-3): Doesn't interfere with normal activities, abdomen soft and not tender to touch.     - MODERATE (4-7): Interferes with normal activities or awakens from sleep, abdomen tender to touch.     - SEVERE (8-10): Excruciating pain, doubled over, unable to  do any normal activities.       Not as bad now  7. RECURRENT SYMPTOM: "Have you ever had this type of stomach pain before?" If Yes, ask: "When was the last time?" and "What happened that time?"      Yes seen in ED today  8. CAUSE: "What do you think is causing the stomach pain?"     na 9. RELIEVING/AGGRAVATING FACTORS: "What makes it better or worse?" (e.g., antacids, bending or twisting motion, bowel movement)     na 10. OTHER SYMPTOMS: "Do you have any other symptoms?" (e.g., back pain, diarrhea, fever, urination pain, vomiting)       na  Protocols used: Abdominal Pain - Male-A-AH

## 2022-07-29 ENCOUNTER — Other Ambulatory Visit: Payer: Self-pay

## 2022-08-02 ENCOUNTER — Ambulatory Visit: Payer: Federal, State, Local not specified - PPO | Admitting: Physician Assistant

## 2022-12-27 ENCOUNTER — Ambulatory Visit (INDEPENDENT_AMBULATORY_CARE_PROVIDER_SITE_OTHER): Payer: Federal, State, Local not specified - PPO

## 2022-12-27 ENCOUNTER — Ambulatory Visit
Admission: EM | Admit: 2022-12-27 | Discharge: 2022-12-27 | Disposition: A | Discharge status: Home / Self Care | Payer: Federal, State, Local not specified - PPO | Attending: Family Medicine | Admitting: Family Medicine

## 2022-12-27 DIAGNOSIS — G8929 Other chronic pain: Secondary | ICD-10-CM

## 2022-12-27 DIAGNOSIS — M545 Low back pain, unspecified: Secondary | ICD-10-CM

## 2022-12-27 MED ORDER — CYCLOBENZAPRINE HCL 10 MG PO TABS: 10.0000 mg | ORAL_TABLET | Freq: Three times a day (TID) | ORAL | 0 refills | Status: DC | PRN

## 2022-12-27 MED ORDER — IBUPROFEN 800 MG PO TABS
800.0000 mg | ORAL_TABLET | Freq: Once | ORAL | Status: AC
  Administered 2022-12-27: 800 mg via ORAL

## 2022-12-27 MED ORDER — PREDNISONE 10 MG (21) PO TBPK: ORAL_TABLET | Freq: Every day | ORAL | 0 refills | Status: DC

## 2022-12-27 MED ORDER — IBUPROFEN 800 MG PO TABS: 800.0000 mg | ORAL_TABLET | Freq: Three times a day (TID) | ORAL | 0 refills | Status: AC | PRN

## 2022-12-27 MED ORDER — KETOROLAC TROMETHAMINE 30 MG/ML IJ SOLN: 30.0000 mg | Freq: Once | INTRAMUSCULAR | Status: DC

## 2022-12-27 NOTE — ED Provider Notes (Addendum)
[facility]    CSN: [id number] Arrival date & time: 12/27/22  1032      History   Chief Complaint Chief Complaint  Patient presents with   Back Pain    HPI  HPI Khali T Oleksy is a 32 y.o. male.   [place] presents for low back pain that started yesterday.  Pain worse with movement. Has history of bulging disc and was seen by a chiropractor.  Pain doesn't radiate and is described as throbbing at rest and sharp with movement. Pain 7/10.  Took Tylenol without relief. 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    Denies weakness, numbness, tingling, dysuria, leg pain, leg weakness, abdominal pain, abdominal swelling, chest pain, incontinence, pelvic pain, perianal numbness.   Continues to have pain with movement. No falls or recent trauma.    Past Medical History:  Diagnosis Date   Acid reflux     There are no problems to display for this patient.   History reviewed. No pertinent surgical history.     Home Medications    Prior to Admission medications   Medication Sig Start Date End Date Taking? Authorizing Provider  predniSONE (STERAPRED UNI-PAK 21 TAB) 10 MG (21) TBPK tablet Take by mouth daily. Take 6 tabs by mouth daily for 1, then 5 tabs for 1 day, then 4 tabs for 1 day, then 3 tabs for 1 day, then 2 tabs for 1 day, then 1 tab for 1 day. 12/27/22  Yes Vestal Markin, DO  cyclobenzaprine (FLEXERIL) 10 MG tablet Take 1 tablet (10 mg total) by mouth 3 (three) times daily as needed. 12/27/22   Javien Tesch, Seward Meth, DO  ibuprofen (ADVIL) 800 MG tablet Take 1 tablet (800 mg total) by mouth every 8 (eight) hours as needed for moderate pain (pain score 4-6). 12/27/22   Katha Cabal, DO    Family History History reviewed. No pertinent family history.  Social History Social History   Tobacco Use   Smoking status: Every Day    Current packs/day: 0.50    Average packs/day: 0.5 packs/day for 6.0 years (3.0 ttl pk-yrs)     Types: Cigarettes   Smokeless tobacco: Never  Vaping Use   Vaping status: Never Used  Substance Use Topics   Alcohol use: Yes    Comment: occasional   Drug use: Yes    Types: Marijuana    Comment: Daily      Allergies   Amoxicillin   Review of Systems Review of Systems: egative unless otherwise stated in HPI.      Physical Exam Triage Vital Signs ED Triage Vitals  Encounter Vitals Group     BP 12/27/22 1231 120/80     Systolic BP Percentile --      Diastolic BP Percentile --      Pulse Rate 12/27/22 1231 61     Resp 12/27/22 1231 18     Temp 12/27/22 1231 97.8 F (36.6 C)     Temp Source 12/27/22 1231 Oral     SpO2 12/27/22 1231 98 %     Weight --      Height --      Head Circumference --      Peak Flow --      Pain Score 12/27/22 1230 9     Pain Loc --      Pain Education --      Exclude from Growth Chart --    No  data found.  Updated Vital Signs BP 120/80 (BP Location: Right Arm)   Pulse 61   Temp 97.8 F (36.6 C) (Oral)   Resp 18   SpO2 98%   Visual Acuity Right Eye Distance:   Left Eye Distance:   Bilateral Distance:    Right Eye Near:   Left Eye Near:    Bilateral Near:     Physical Exam GEN: well appearing male in no acute distress  CVS: well perfused  RESP: speaking in full sentences without pause, no respiratory distress  MSK:  Lumbar spine: - Inspection: no gross deformity or asymmetry, swelling or ecchymosis. No skin changes  - Palpation: TTP over the lower lumbar spinous processes, no bilateral lumbar paraspinal muscle tenderness, no SI joint tenderness bilaterally - ROM: limited active ROM of the lumbar spine in flexion and extension 2/2 to pain - Strength: 5/5 strength of lower extremity in L4-S1 nerve root distributions b/l - Neuro: sensation intact in the L4-S1 nerve root distribution b/l, 2+ L4 and S1 reflexes - Special testing: positive bilateral straight leg raise SKIN: warm, dry, no overly skin rash or erythema    UC  Treatments / Results  Labs (all labs ordered are listed, but only abnormal results are displayed) Labs Reviewed - No data to display  EKG   Radiology DG Lumbar Spine Complete  Result Date: 12/27/2022 CLINICAL DATA:  Low back pain for 1 day.  Midline pain. EXAM: LUMBAR SPINE - COMPLETE 4+ VIEW COMPARISON:  None Available. FINDINGS: There are 5 non-rib-bearing lumbar vertebra. Normal alignment. No listhesis. Normal vertebral body heights. No fracture or compression deformity. Disc spaces are preserved. No evidence of pars defects or focal bone abnormalities. The sacroiliac joints are normal. IMPRESSION: Negative radiographs of the lumbar spine. Electronically Signed   By: Narda Rutherford M.D.   On: 12/27/2022 15:32     Procedures Procedures (including critical care time)  Medications Ordered in UC Medications  ibuprofen (ADVIL) tablet 800 mg (800 mg Oral Given 12/27/22 1408)    Initial Impression / Assessment and Plan / UC Course  I have reviewed the triage vital signs and the nursing notes.  Pertinent labs & imaging results that were available during my care of the patient were reviewed by me and considered in my medical decision making (see chart for details).      Pt is a 32 y.o.  male with history of low back pain presents with acute on chronic flare up of his lower back pain.  Offered Toradol for pain but he declined.  Ibuprofen given instead.  Obtained lumbar plain films.  Xray personally interpreted by me were unremarkable for fracture, or significant malalignment.  Radiologist report reviewed.  Patient to gradually return to normal activities, as tolerated and continue ordinary activities within the limits permitted by pain. Prescribed prednisone, ibuprofen and muscle relaxer  for pain relief.  Advised patient to avoid other NSAIDs while taking ibuprofen prescription. Tylenol and Lidocaine patches PRN for multimodal pain relief. Counseled patient on red flag symptoms and when  to seek immediate care.  No red flags suggesting cauda equina syndrome or progressive major motor weakness. Patient to follow up with orthopedic provider if symptoms do not improve with conservative treatment.  Return and ED precautions given.    Discussed MDM, treatment plan and plan for follow-up with patient who agrees with plan.   Final Clinical Impressions(s) / UC Diagnoses   Final diagnoses:  Chronic midline low back pain without sciatica  Discharge Instructions      If medication was prescribed, stop by the pharmacy to pick up your prescriptions.  For your back pain, Take 1000 mg Tylenol three times a day, take muscle relaxer (Flexeril) up to 3 times a day, take ibuprofen 3 times a day,  as needed for pain. Consider stopping by the pharmacy or dollar store to pick up some Lidocaine patches. Apply for 12 hours and then remove.   Watch for worsening symptoms such as an increasing weakness or loss of sensation in your arms or legs, increasing pain and/or the loss of bladder or bowel function. Should any of these occur, go to the emergency department immediately.   Follow up with your chiropractor or an orthopedic provider        ED Prescriptions     Medication Sig Dispense Auth. Provider   cyclobenzaprine (FLEXERIL) 10 MG tablet Take 1 tablet (10 mg total) by mouth 3 (three) times daily as needed. 15 tablet Ailani Governale, DO   ibuprofen (ADVIL) 800 MG tablet Take 1 tablet (800 mg total) by mouth every 8 (eight) hours as needed for moderate pain (pain score 4-6). 15 tablet Indi Willhite, DO   predniSONE (STERAPRED UNI-PAK 21 TAB) 10 MG (21) TBPK tablet Take by mouth daily. Take 6 tabs by mouth daily for 1, then 5 tabs for 1 day, then 4 tabs for 1 day, then 3 tabs for 1 day, then 2 tabs for 1 day, then 1 tab for 1 day. 21 tablet Katha Cabal, DO      PDMP not reviewed this encounter.       Katha Cabal, DO 12/27/22 2040

## 2022-12-27 NOTE — Discharge Instructions (Addendum)
If medication was prescribed, stop by the pharmacy to pick up your prescriptions.  For your back pain, Take 1000 mg Tylenol three times a day, take muscle relaxer (Flexeril) up to 3 times a day, take ibuprofen 3 times a day,  as needed for pain. Consider stopping by the pharmacy or dollar store to pick up some Lidocaine patches. Apply for 12 hours and then remove.   Watch for worsening symptoms such as an increasing weakness or loss of sensation in your arms or legs, increasing pain and/or the loss of bladder or bowel function. Should any of these occur, go to the emergency department immediately.   Follow up with your chiropractor or an orthopedic provider

## 2022-12-27 NOTE — ED Triage Notes (Addendum)
Lower back pain x 1 day. Last IBU yesterday.

## 2023-12-25 ENCOUNTER — Ambulatory Visit
Admission: EM | Admit: 2023-12-25 | Discharge: 2023-12-25 | Disposition: A | Discharge status: Home / Self Care | Attending: Emergency Medicine | Admitting: Emergency Medicine

## 2023-12-25 DIAGNOSIS — M545 Low back pain, unspecified: Secondary | ICD-10-CM

## 2023-12-25 DIAGNOSIS — G8929 Other chronic pain: Secondary | ICD-10-CM

## 2023-12-25 MED ORDER — BACLOFEN 10 MG PO TABS: 10.0000 mg | ORAL_TABLET | Freq: Three times a day (TID) | ORAL | 0 refills | Status: DC

## 2023-12-25 MED ORDER — PREDNISONE 10 MG (21) PO TBPK: ORAL_TABLET | ORAL | 0 refills | Status: DC

## 2023-12-25 NOTE — ED Provider Notes (Signed)
 [facility]    CSN: [id number] Arrival date & time: 12/25/23  1109      History   Chief Complaint Chief Complaint  Patient presents with   Back Pain    HPI Lance Hammond is a 33 y.o. male.   HPI  33 year old male with past medical history significant for chronic midline low back pain and acid reflux presents for evaluation of 3 days worth of low back pain with radiation down into both legs to the level of the knee.  He reports that he has recently been lifting more heavy objects at work but he has not had any falls, injuries, or trauma.  He denies any loss of bowel or bladder control denies any saddle anesthesia.  Past Medical History:  Diagnosis Date   Acid reflux     There are no active problems to display for this patient.   History reviewed. No pertinent surgical history.     Home Medications    Prior to Admission medications   Medication Sig Start Date End Date Taking? Authorizing Provider  baclofen (LIORESAL) 10 MG tablet Take 1 tablet (10 mg total) by mouth 3 (three) times daily. 12/25/23  Yes Bernardino Ditch, NP  predniSONE  (STERAPRED UNI-PAK 21 TAB) 10 MG (21) TBPK tablet Take 6 tablets on day 1, 5 tablets day 2, 4 tablets day 3, 3 tablets day 4, 2 tablets day 5, 1 tablet day 6 12/25/23  Yes Bernardino Ditch, NP  ibuprofen  (ADVIL ) 800 MG tablet Take 1 tablet (800 mg total) by mouth every 8 (eight) hours as needed for moderate pain (pain score 4-6). 12/27/22   Brimage, Vondra, DO    Family History History reviewed. No pertinent family history.  Social History Social History   Tobacco Use   Smoking status: Every Day    Current packs/day: 0.50    Average packs/day: 0.5 packs/day for 6.0 years (3.0 ttl pk-yrs)    Types: Cigarettes   Smokeless tobacco: Never  Vaping Use   Vaping status: Never Used  Substance Use Topics   Alcohol use: Yes    Comment: occasional   Drug use: Yes    Types: Marijuana    Comment: Daily      Allergies    Amoxicillin   Review of Systems Review of Systems  Constitutional:  Negative for fever.  Genitourinary:  Negative for difficulty urinating.  Musculoskeletal:  Positive for back pain.     Physical Exam Triage Vital Signs ED Triage Vitals  Encounter Vitals Group     BP      Girls Systolic BP Percentile      Girls Diastolic BP Percentile      Boys Systolic BP Percentile      Boys Diastolic BP Percentile      Pulse      Resp      Temp      Temp src      SpO2      Weight      Height      Head Circumference      Peak Flow      Pain Score      Pain Loc      Pain Education      Exclude from Growth Chart    No data found.  Updated Vital Signs BP 107/74 (BP Location: Left Arm)   Pulse 69   Temp 97.8 F (36.6 C) (Oral)   Wt 188 lb 4.8 oz (85.4 kg)   SpO2 96%  BMI 25.54 kg/m   Visual Acuity Right Eye Distance:   Left Eye Distance:   Bilateral Distance:    Right Eye Near:   Left Eye Near:    Bilateral Near:     Physical Exam Vitals and nursing note reviewed.  Constitutional:      Appearance: Normal appearance. He is not ill-appearing.  HENT:     Head: Normocephalic and atraumatic.  Musculoskeletal:        General: Tenderness present. No signs of injury.  Skin:    General: Skin is warm and dry.     Capillary Refill: Capillary refill takes less than 2 seconds.     Findings: No bruising or erythema.  Neurological:     General: No focal deficit present.     Mental Status: He is alert and oriented to person, place, and time.     Motor: No weakness.      UC Treatments / Results  Labs (all labs ordered are listed, but only abnormal results are displayed) Labs Reviewed - No data to display  EKG   Radiology No results found.  Procedures Procedures (including critical care time)  Medications Ordered in UC Medications - No data to display  Initial Impression / Assessment and Plan / UC Course  I have reviewed the triage vital signs and the  nursing notes.  Pertinent labs & imaging results that were available during my care of the patient were reviewed by me and considered in my medical decision making (see chart for details).   Patient is a nontoxic-appearing 33 year old male presenting for evaluation of acute on chronic low back pain.  He has not had any recent injuries or falls but he reports that recently he has been having to lift heavier bundles at work and he thinks this contributed to his pain.  No saddle anesthesia or fecal or urinary incontinence.  He has no midline spinous process tenderness or step-off in his lumbar spine.  He does have some tenderness in the lower lumbar paraspinous region bilaterally at the L5-S1 juncture.  The pain does increase with lateral rotation as well as flexion and extension of his back.  He has a negative straight leg raise bilaterally.  Previous imaging of his low back from October 2024 shows normal alignment of his lumbar spine with no listhesis and normal vertebral body height.  No fracture or compression deformity.  Disc spaces are preserved.  No evidence of pars defect or focal bone abnormality noted.  SI joints were normal.  I will treat the patient for acute on chronic low back pain.  I did offer him an injection of pain medication here in clinic but he is declining injection at this time.  I will discharge him home on a prednisone  taper and baclofen.  Also we discussed home physical therapy exercises and moist heat application.  Additionally, I will give him lifting restrictions for work for the next week to give his low back pain a chance to calm down.  I have advised him that if his symptoms do not improve he will need to follow-up with a spine specialist.   Final Clinical Impressions(s) / UC Diagnoses   Final diagnoses:  Acute on chronic low back pain     Discharge Instructions      Take the prednisone  according to the package instructions to help with pain and inflammation in your low  back.  Take it each morning at breakfast time over the next 6 days.  Take the baclofen,  10 mg every 8 hours, on a schedule for the next 48 hours and then as needed.  Apply moist heat to your back for 30 minutes at a time 2-3 times a day to improve blood flow to the area and help remove the lactic acid causing the spasm.  Follow the back exercises given at discharge.  Return for reevaluation for any new or worsening symptoms.      ED Prescriptions     Medication Sig Dispense Auth. Provider   predniSONE  (STERAPRED UNI-PAK 21 TAB) 10 MG (21) TBPK tablet Take 6 tablets on day 1, 5 tablets day 2, 4 tablets day 3, 3 tablets day 4, 2 tablets day 5, 1 tablet day 6 21 tablet Bernardino Ditch, NP   baclofen (LIORESAL) 10 MG tablet Take 1 tablet (10 mg total) by mouth 3 (three) times daily. 30 each Bernardino Ditch, NP      PDMP not reviewed this encounter.   Bernardino Ditch, NP 12/25/23 1156

## 2023-12-25 NOTE — ED Triage Notes (Signed)
 Pt c/o back pain and side pain x3days  Pt states that he had a bulging disc 1 year ago and was seen by chiropracty  Pt believes that his disc slipped again, and is having the same lower right back pain.   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

## 2023-12-25 NOTE — Discharge Instructions (Addendum)
 Take the prednisone  according to the package instructions to help with pain and inflammation in your low back.  Take it each morning at breakfast time over the next 6 days.  Take the baclofen, 10 mg every 8 hours, on a schedule for the next 48 hours and then as needed.  Apply moist heat to your back for 30 minutes at a time 2-3 times a day to improve blood flow to the area and help remove the lactic acid causing the spasm.  Follow the back exercises given at discharge.  Return for reevaluation for any new or worsening symptoms.

## 2024-01-27 IMAGING — CR DG NECK SOFT TISSUE
2 series · 2 of 2 positions shown · non-contrast
Comparison: No prior head or neck imaging.

CLINICAL DATA: 31-year-old male with globus sensation after eating
fish, possible retained fishbone.

EXAM:
NECK SOFT TISSUES - 1+ VIEW

[neck lat]
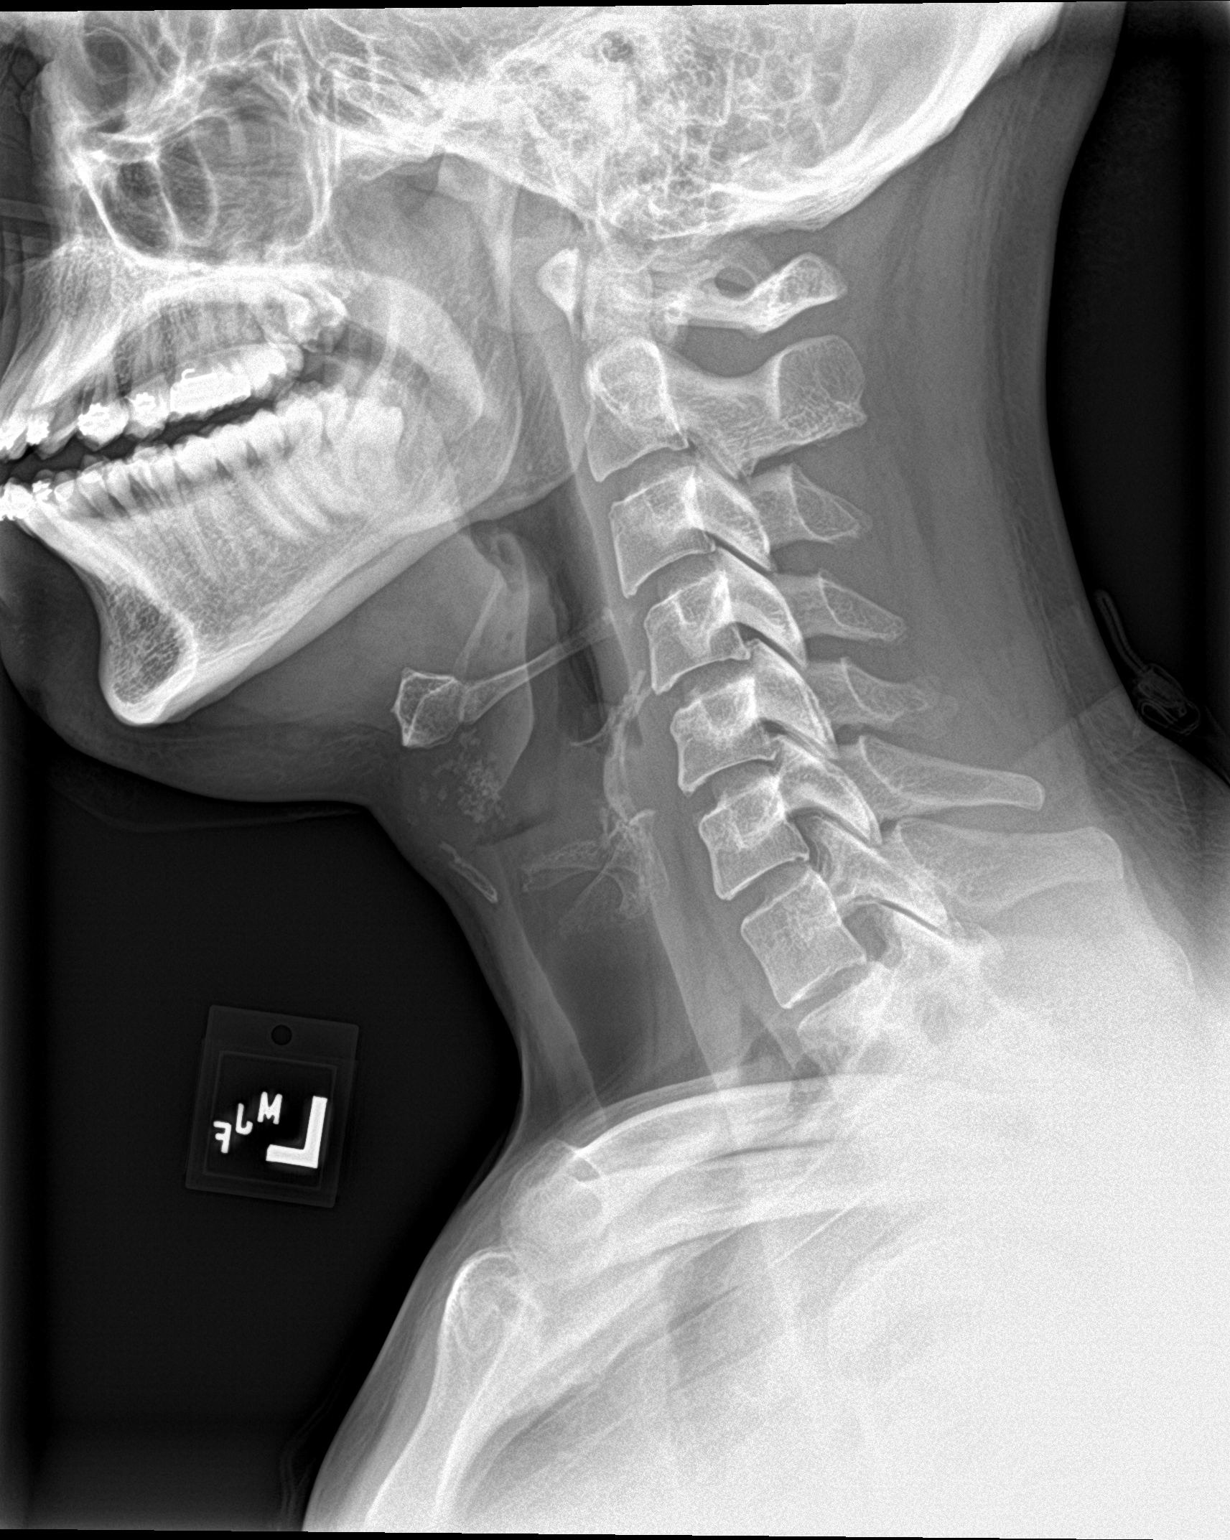

[neck ap]
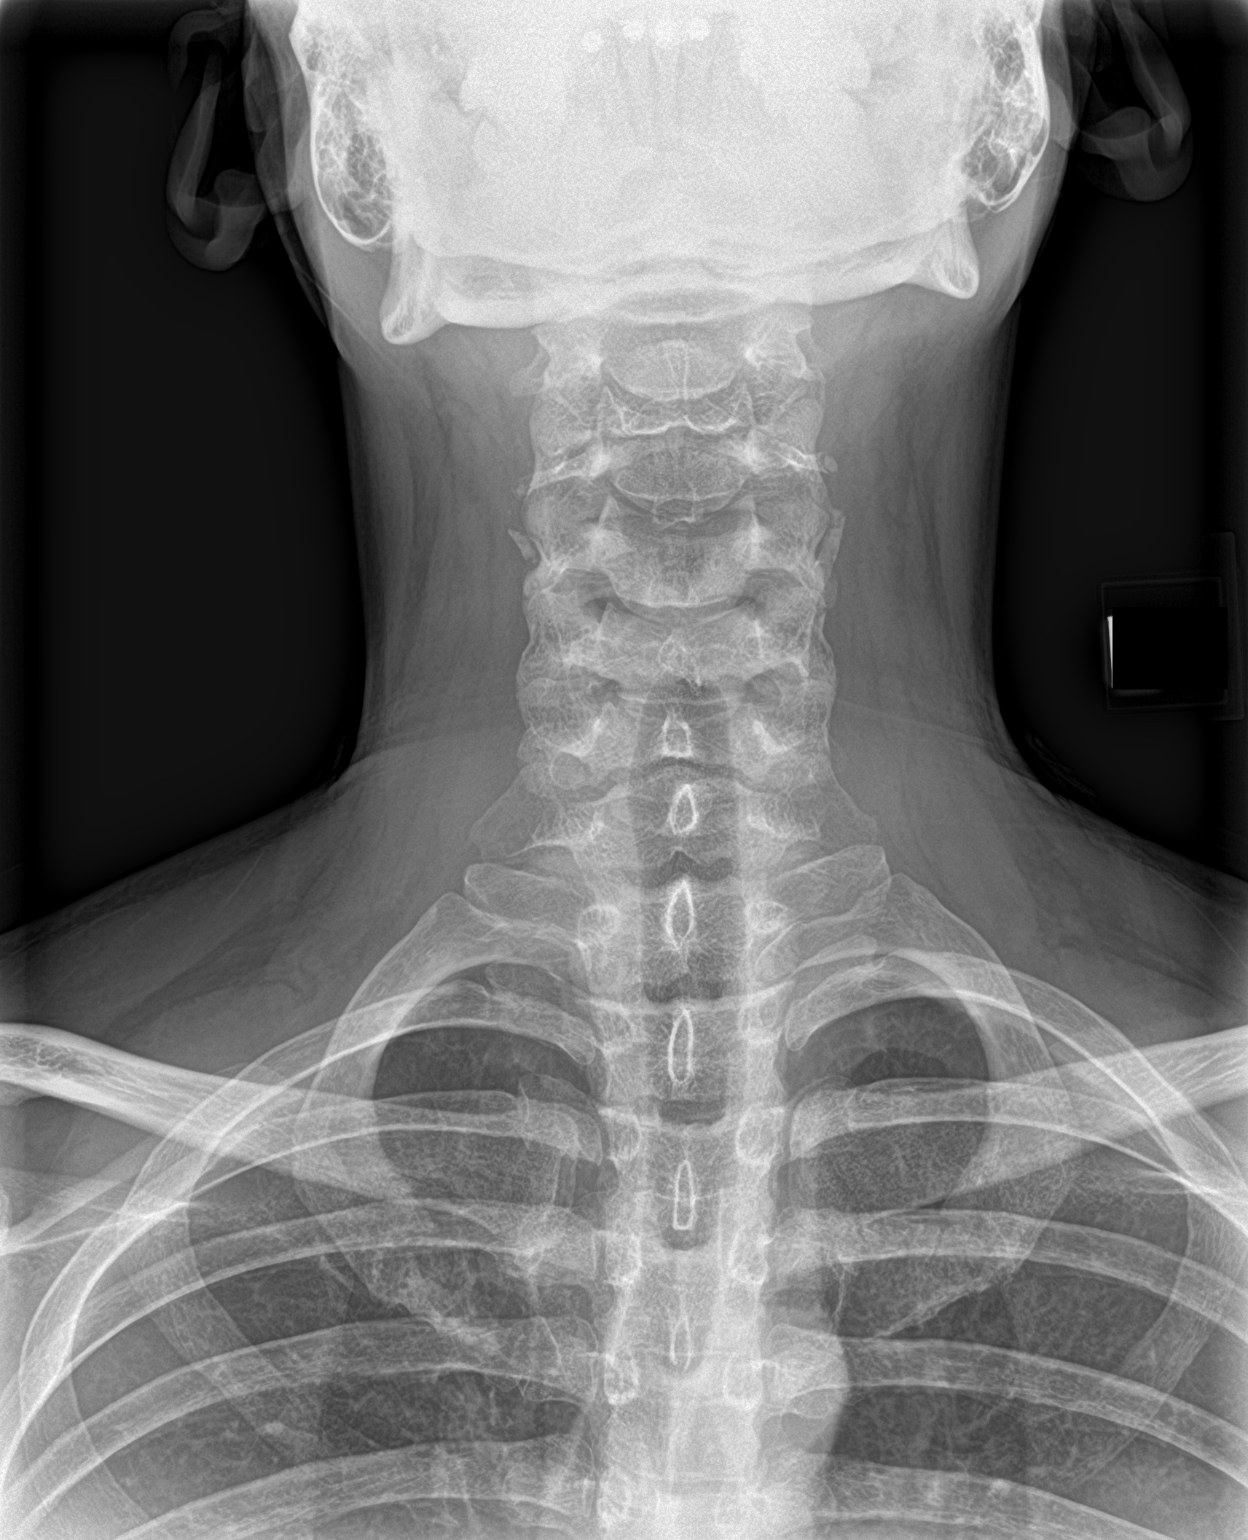

[2 of 2 positions shown; findings below may reference images not displayed]

FINDINGS: On the lateral view there is a suspicious linear 2 cm calcified
density in the vallecula, with suggestion of regional vallecula soft
tissue swelling and mildly irregular appearance of the adjacent
ventral epiglottis.

But other pharyngeal and laryngeal contours are normal. Visualized
tracheal air column is within normal limits. Negative lung apices.
No acute osseous abnormality identified.
IMPRESSION: Linear 2 cm density in the vallecula highly suspicious for retained
fishbone in this setting. Otherwise pharyngeal and laryngeal
contours are normal.

## 2024-04-13 ENCOUNTER — Ambulatory Visit
Admission: EM | Admit: 2024-04-13 | Discharge: 2024-04-13 | Disposition: A | Discharge status: Home / Self Care | Attending: Physician Assistant | Admitting: Physician Assistant

## 2024-04-13 ENCOUNTER — Encounter: Payer: Self-pay | Admitting: Emergency Medicine

## 2024-04-13 ENCOUNTER — Ambulatory Visit

## 2024-04-13 DIAGNOSIS — S43102A Unspecified dislocation of left acromioclavicular joint, initial encounter: Secondary | ICD-10-CM

## 2024-04-13 DIAGNOSIS — W19XXXA Unspecified fall, initial encounter: Secondary | ICD-10-CM | POA: Diagnosis not present

## 2024-04-13 DIAGNOSIS — M25512 Pain in left shoulder: Secondary | ICD-10-CM

## 2024-04-13 MED ORDER — HYDROCODONE-ACETAMINOPHEN 5-325 MG PO TABS: 1.0000 | ORAL_TABLET | Freq: Four times a day (QID) | ORAL | 0 refills | Status: AC | PRN

## 2024-04-13 NOTE — ED Provider Notes (Signed)
 " [facility]    CSN: [id number] Arrival date & time: 04/13/24  1014      History   Chief Complaint Chief Complaint  Patient presents with   Fall    HPI Lance Hammond is a 34 y.o. right hand dominant male presenting for left shoulder pain following an accidental slip and fall on ice 2 days ago. Reports limited ROM of the shoulder. When he moves the shoulder, he reports pain shoots up to neck. Most pain is of the scapula. No numbness or weakness. No previous major injuries to affected shoulder. He has been taking Motrin .  HPI  Past Medical History:  Diagnosis Date   Acid reflux     There are no active problems to display for this patient.   History reviewed. No pertinent surgical history.     Home Medications    Prior to Admission medications  Medication Sig Start Date End Date Taking? Authorizing Provider  HYDROcodone -acetaminophen  (NORCO/VICODIN) 5-325 MG tablet Take 1 tablet by mouth every 6 (six) hours as needed for up to 3 days for severe pain (pain score 7-10). 04/13/24 04/16/24 Yes Arvis Jolan NOVAK, PA-C  ibuprofen  (ADVIL ) 800 MG tablet Take 1 tablet (800 mg total) by mouth every 8 (eight) hours as needed for moderate pain (pain score 4-6). 12/27/22   Brimage, Vondra, DO    Family History History reviewed. No pertinent family history.  Social History Social History[1]   Allergies   Amoxicillin   Review of Systems Review of Systems  Musculoskeletal:  Positive for arthralgias and neck pain. Negative for back pain, gait problem and joint swelling.  Skin:  Negative for color change and wound.  Neurological:  Negative for weakness.     Physical Exam Triage Vital Signs ED Triage Vitals  Encounter Vitals Group     BP 04/13/24 1041 121/82     Girls Systolic BP Percentile --      Girls Diastolic BP Percentile --      Boys Systolic BP Percentile --      Boys Diastolic BP Percentile --      Pulse Rate 04/13/24 1041 61     Resp 04/13/24 1041  15     Temp 04/13/24 1041 97.7 F (36.5 C)     Temp Source 04/13/24 1041 Oral     SpO2 04/13/24 1041 96 %     Weight 04/13/24 1039 194 lb (88 kg)     Height 04/13/24 1039 6' (1.829 m)     Head Circumference --      Peak Flow --      Pain Score 04/13/24 1039 7     Pain Loc --      Pain Education --      Exclude from Growth Chart --    No data found.  Updated Vital Signs BP 121/82 (BP Location: Right Arm)   Pulse 61   Temp 97.7 F (36.5 C) (Oral)   Resp 15   Ht 6' (1.829 m)   Wt 194 lb (88 kg)   SpO2 96%   BMI 26.31 kg/m       Physical Exam Vitals and nursing note reviewed.  Constitutional:      General: He is not in acute distress.    Appearance: Normal appearance. He is well-developed. He is not ill-appearing.  HENT:     Head: Normocephalic and atraumatic.  Eyes:     General: No scleral icterus.    Conjunctiva/sclera: Conjunctivae normal.  Cardiovascular:  Rate and Rhythm: Normal rate.  Pulmonary:     Effort: Pulmonary effort is normal. No respiratory distress.  Musculoskeletal:     Left shoulder: Tenderness (AC joint, posterior scapula) present. No swelling or deformity. Decreased range of motion. Normal pulse.     Cervical back: Neck supple.  Skin:    General: Skin is warm and dry.     Capillary Refill: Capillary refill takes less than 2 seconds.  Neurological:     General: No focal deficit present.     Mental Status: He is alert. Mental status is at baseline.     Motor: No weakness.     Gait: Gait normal.  Psychiatric:        Mood and Affect: Mood normal.        Behavior: Behavior normal.      UC Treatments / Results  Labs (all labs ordered are listed, but only abnormal results are displayed) Labs Reviewed - No data to display  EKG   Radiology DG Shoulder Left Result Date: 04/13/2024 EXAM: 1 VIEW(S) XRAY OF THE LEFT SHOULDER 04/13/2024 11:06:42 AM COMPARISON: None available. CLINICAL HISTORY: Shoulder pain after fall. FINDINGS: BONES AND  JOINTS: Glenohumeral joint is normally aligned. No acute fracture. No malalignment. The acromioclavicular interval measures 0.4 cm. The coracoclavicular interval measures 1.3 cm. There is slight elevation of the distal clavicle relative to the acromion. SOFT TISSUES: No abnormal calcifications. Visualized lung is unremarkable. IMPRESSION: 1. Slight elevation of the distal clavicle relative to the acromion which may reflect acromioclavicular joint separation. 2. If symptoms persist or there is focal acromioclavicular tenderness/instability, consider dedicated acromioclavicular joint radiographs (including contralateral comparison) and outpatient orthopedic follow-up; MRI can be considered for suspected ligamentous injury if pain or functional limitation fails to improve. 3. No fracture identified. Electronically signed by: Waddell Calk MD 04/13/2024 11:33 AM EST RP Workstation: [id number]    Procedures Procedures (including critical care time)  Medications Ordered in UC Medications - No data to display  Initial Impression / Assessment and Plan / UC Course  I have reviewed the triage vital signs and the nursing notes.  Pertinent labs & imaging results that were available during my care of the patient were reviewed by me and considered in my medical decision making (see chart for details).   34 year old male presents for left shoulder pain after accidental slip and fall on ice.  Patient reports landing on the left shoulder.  Since then he has had reduced range of motion of the shoulder and significant pain, mostly at the scapula.  X-ray of the left shoulder obtained shows concern for [facility] joint separation and possible underlying ligamentous tear.  Results reviewed with patient.  He was placed in a sling.  Advised to continue ibuprofen  and avoiding painful activities.  Norco sent as needed for severe pain.  Encouraged him to follow-up with orthopedics next week and discussed not using affected extremity  until cleared by orthopedics.  May need MRI and possible surgery depending on MRI results.  Work note was given.   Final Clinical Impressions(s) / UC Diagnoses   Final diagnoses:  Acute pain of left shoulder  Separation of left acromioclavicular joint, initial encounter  Fall, initial encounter     Discharge Instructions      - The x-ray is concerning for [facility] joint separation which can indicate a underlying ligament tear. - Wear the sling and avoid using the left arm until you see orthopedics and they clear you. - You can continue ibuprofen  for pain.  I also sent something else for pain but if you are using the sling and avoiding anything that causes pain, pain should be manageable. - Follow-up with orthopedics next week.  You have a condition requiring you to follow up with Orthopedics so please call one of the following office for appointment:   [facility] Address: [address] [place], [place] [address] Phone: [phone]  [facility] [address] [place], [place] [address] Phone: [phone]  [facility] [address], [place], [place] [address] Phone: [phone]      ED Prescriptions     Medication Sig Dispense Auth. Provider   HYDROcodone -acetaminophen  (NORCO/VICODIN) 5-325 MG tablet Take 1 tablet by mouth every 6 (six) hours as needed for up to 3 days for severe pain (pain score 7-10). 12 tablet Arvis Jolan NOVAK, PA-C      I have reviewed the PDMP during this encounter.     [1]  Social History Tobacco Use   Smoking status: Every Day    Current packs/day: 0.50    Average packs/day: 0.5 packs/day for 6.0 years (3.0 ttl pk-yrs)    Types: Cigarettes   Smokeless tobacco: Never  Vaping Use   Vaping status: Never Used  Substance Use Topics   Alcohol use: Yes    Comment: occasional   Drug use: Yes    Types: Marijuana    Comment: Daily      Arvis Jolan NOVAK DEVONNA 04/13/24 1339  "

## 2024-04-13 NOTE — ED Triage Notes (Signed)
 Patient states that he slipped on some ice 2 days ago outside his home.  Patient states that he landed on his left side.  Patient c/o left shoulder pain and neck pain.

## 2024-04-13 NOTE — Discharge Instructions (Addendum)
-   The x-ray is concerning for [facility] joint separation which can indicate a underlying ligament tear. - Wear the sling and avoid using the left arm until you see orthopedics and they clear you. - You can continue ibuprofen  for pain.  I also sent something else for pain but if you are using the sling and avoiding anything that causes pain, pain should be manageable. - Follow-up with orthopedics next week.  You have a condition requiring you to follow up with Orthopedics so please call one of the following office for appointment:   [facility] Address: [address], [place], [place] [address] Phone: [phone]  [facility] [address], [place], [place] [address] Phone: [phone]  [facility] [address], [place], [place] [address] Phone: [phone]
